# Patient Record
Sex: Female | Born: 1935 | Race: White | Hispanic: No | Marital: Married | Smoking: Never smoker
Health system: Southern US, Community
[De-identification: ages and names within clinical notes are randomized; demographics above are authoritative.]

## PROBLEM LIST (undated history)

## (undated) DIAGNOSIS — G4730 Sleep apnea, unspecified: Secondary | ICD-10-CM

## (undated) DIAGNOSIS — I208 Other forms of angina pectoris: Secondary | ICD-10-CM

## (undated) DIAGNOSIS — Z8711 Personal history of peptic ulcer disease: Secondary | ICD-10-CM

## (undated) DIAGNOSIS — I639 Cerebral infarction, unspecified: Secondary | ICD-10-CM

## (undated) DIAGNOSIS — Z9981 Dependence on supplemental oxygen: Secondary | ICD-10-CM

## (undated) DIAGNOSIS — N200 Calculus of kidney: Secondary | ICD-10-CM

## (undated) DIAGNOSIS — J45909 Unspecified asthma, uncomplicated: Secondary | ICD-10-CM

## (undated) DIAGNOSIS — I2089 Other forms of angina pectoris: Secondary | ICD-10-CM

## (undated) DIAGNOSIS — M1990 Unspecified osteoarthritis, unspecified site: Secondary | ICD-10-CM

## (undated) DIAGNOSIS — C9110 Chronic lymphocytic leukemia of B-cell type not having achieved remission: Secondary | ICD-10-CM

## (undated) DIAGNOSIS — I10 Essential (primary) hypertension: Secondary | ICD-10-CM

## (undated) DIAGNOSIS — Z8719 Personal history of other diseases of the digestive system: Secondary | ICD-10-CM

## (undated) DIAGNOSIS — E785 Hyperlipidemia, unspecified: Secondary | ICD-10-CM

## (undated) DIAGNOSIS — G459 Transient cerebral ischemic attack, unspecified: Secondary | ICD-10-CM

## (undated) HISTORY — DX: Unspecified asthma, uncomplicated: J45.909

## (undated) HISTORY — PX: LAPAROSCOPIC CHOLECYSTECTOMY: SUR755

## (undated) HISTORY — DX: Hyperlipidemia, unspecified: E78.5

## (undated) HISTORY — DX: Other forms of angina pectoris: I20.89

## (undated) HISTORY — PX: CARDIAC CATHETERIZATION: SHX172

## (undated) HISTORY — DX: Essential (primary) hypertension: I10

## (undated) HISTORY — DX: Cerebral infarction, unspecified: I63.9

## (undated) HISTORY — DX: Sleep apnea, unspecified: G47.30

## (undated) HISTORY — DX: Other forms of angina pectoris: I20.8

---

## 2006-04-08 ENCOUNTER — Encounter (INDEPENDENT_AMBULATORY_CARE_PROVIDER_SITE_OTHER): Payer: Self-pay | Admitting: Oncology

## 2006-04-08 ENCOUNTER — Other Ambulatory Visit: Admission: RE | Admit: 2006-04-08 | Discharge: 2006-04-08 | Payer: Self-pay | Admitting: Oncology

## 2006-04-11 ENCOUNTER — Ambulatory Visit: Payer: Self-pay | Admitting: Oncology

## 2006-09-30 ENCOUNTER — Ambulatory Visit: Payer: Self-pay | Admitting: Oncology

## 2013-01-12 ENCOUNTER — Encounter: Payer: Self-pay | Admitting: Pulmonary Disease

## 2013-01-12 ENCOUNTER — Ambulatory Visit (INDEPENDENT_AMBULATORY_CARE_PROVIDER_SITE_OTHER): Payer: Medicare Other | Admitting: Pulmonary Disease

## 2013-01-12 VITALS — BP 132/80 | HR 84 | Temp 98.2°F | Ht 61.0 in | Wt 191.0 lb

## 2013-01-12 DIAGNOSIS — R0609 Other forms of dyspnea: Secondary | ICD-10-CM

## 2013-01-12 DIAGNOSIS — R9389 Abnormal findings on diagnostic imaging of other specified body structures: Secondary | ICD-10-CM

## 2013-01-12 DIAGNOSIS — R0600 Dyspnea, unspecified: Secondary | ICD-10-CM | POA: Insufficient documentation

## 2013-01-12 DIAGNOSIS — J9610 Chronic respiratory failure, unspecified whether with hypoxia or hypercapnia: Secondary | ICD-10-CM

## 2013-01-12 DIAGNOSIS — R918 Other nonspecific abnormal finding of lung field: Secondary | ICD-10-CM | POA: Insufficient documentation

## 2013-01-12 NOTE — Assessment & Plan Note (Signed)
I have explained to the patient and her family member these areas usually are representative of inflammation, infection, and possibly malignancy.  I would find it very unlikely these are related to cancer, but she does need to get her health maintenance up-to-date.  More than likely these are inflammatory, and would favor a followup CT in 3-4 months after her initial CT.

## 2013-01-12 NOTE — Assessment & Plan Note (Signed)
The patient gives a history for progressive dyspnea on exertion and now at rest, along with oxygen-dependent respiratory failure.  There is nothing to suggest this is related to asthma, and her chest is totally clear today except for crackles at both bases.  She had no thromboembolic disease on a recent CT chest, but this does not exclude the possibility of chronic thromboembolic disease.  She does have a history of significant hypertension requiring multiple medications for control, and her recent chest x-ray did show cardiomegaly with prominent vascular markings.  I am very suspicious that she may have significant diastolic heart failure, especially with crackles on exam in both bases today.  At this point, I would like to schedule for pulmonary function studies, as well as an echocardiogram to look at her heart and also the pulmonary artery pressures.  She is to continue on her current medications at this time.

## 2013-01-12 NOTE — Progress Notes (Signed)
  Subjective:    Patient ID: Ann Beasley, female    DOB: [birth year], 79 y.o.   MRN: [id number]  HPI The patient is a 79 year old female who I've been asked to see for worsening dyspnea and exertion and hypoxemia.  The patient has never smoked, but does have a history of significant long-standing hypertension.  She is having breathing issues with activities for at least a year, and this has been very progressive and debilitating over the last 5 months.  This has gotten to the point that she can become short of breath even at rest if she does not wear oxygen.  She denies significant cough or mucus production, and has not had significant upper extremity edema.  She does have a history of asthma that was diagnosed in her 40s, and currently is on Advair as well as prednisone.  She went to the [facility] emergency room in July for shortness of breath, and had a CT of her chest that did not show a pulmonary embolus or other acute process, but did show pleural based mass like opacities in the right upper and left lower lobes.  Her chest x-ray during this visit was suggestive of mild interstitial edema.  She has no history to suggest a pulmonary infection at the time of her CT chest.  The patient did have a stress test greater than one year ago that was unremarkable, but has not had a recent echocardiogram.  She denies any history of pulmonary embolus or DVT.  She does have a history of sleep apnea for which she is on CPAP but is noncompliant.  She tells me that her health maintenance is not up-to-date, and is overdue for a mammogram.   Review of Systems  Constitutional: Negative for fever and unexpected weight change.  HENT: Positive for rhinorrhea ( with O2). Negative for ear pain, nosebleeds, congestion, sore throat, sneezing, trouble swallowing, dental problem, postnasal drip and sinus pressure.   Eyes: Negative for redness and itching.  Respiratory: Positive for cough and shortness of breath. Negative for  chest tightness and wheezing.   Cardiovascular: Negative for palpitations and leg swelling.  Gastrointestinal: Negative for nausea and vomiting.  Genitourinary: Negative for dysuria.  Musculoskeletal: Negative for joint swelling.  Skin: Negative for rash.  Neurological: Negative for headaches.  Hematological: Does not bruise/bleed easily.  Psychiatric/Behavioral: Negative for dysphoric mood. The patient is not nervous/anxious.        Objective:   Physical Exam Constitutional:  Overweight female, no acute distress  HENT:  Nares patent without discharge  Oropharynx without exudate, palate and uvula are normal  Eyes:  Perrla, eomi, no scleral icterus  Neck:  No JVD, no TMG  Cardiovascular:  Normal rate, regular rhythm, no rubs or gallops.  2/6 sem        Intact distal pulses  Pulmonary :  Normal breath sounds, no stridor or respiratory distress   No rhonchi, or wheezing, significant crackles in both bases.   Abdominal:  Soft, nondistended, bowel sounds present.  No tenderness noted.   Musculoskeletal:  mild lower extremity edema noted.  Lymph Nodes:  No cervical lymphadenopathy noted  Skin:  No cyanosis noted  Neurologic:  Alert, appropriate, moves all 4 extremities without obvious deficit.         Assessment & Plan:

## 2013-01-12 NOTE — Patient Instructions (Addendum)
Will schedule for breathing tests and a sound wave test of your heart. Stay on oxygen 24hrs a day. No change in current medications.

## 2013-01-16 ENCOUNTER — Telehealth: Payer: Self-pay | Admitting: Pulmonary Disease

## 2013-01-16 NOTE — Telephone Encounter (Signed)
I spoke with Marcelino Duster. [facility] has no openings that day. He did have appt 02/13/13 at 10 AM. She just scheduled this appt. Nothing further was needed

## 2013-01-19 ENCOUNTER — Other Ambulatory Visit (HOSPITAL_COMMUNITY): Payer: Medicare Other

## 2013-02-07 ENCOUNTER — Ambulatory Visit (INDEPENDENT_AMBULATORY_CARE_PROVIDER_SITE_OTHER): Payer: Medicare Other | Admitting: Pulmonary Disease

## 2013-02-07 DIAGNOSIS — R0989 Other specified symptoms and signs involving the circulatory and respiratory systems: Secondary | ICD-10-CM

## 2013-02-07 DIAGNOSIS — J9610 Chronic respiratory failure, unspecified whether with hypoxia or hypercapnia: Secondary | ICD-10-CM

## 2013-02-07 DIAGNOSIS — R0600 Dyspnea, unspecified: Secondary | ICD-10-CM

## 2013-02-07 DIAGNOSIS — R0609 Other forms of dyspnea: Secondary | ICD-10-CM

## 2013-02-07 LAB — PULMONARY FUNCTION TEST

## 2013-02-07 NOTE — Progress Notes (Signed)
PFT done today. 

## 2013-02-12 ENCOUNTER — Telehealth: Payer: Self-pay | Admitting: Pulmonary Disease

## 2013-02-12 NOTE — Telephone Encounter (Signed)
LMOM x 1 

## 2013-02-12 NOTE — Telephone Encounter (Signed)
Please let pt know there are some abnormalities on her breathing studies, but trying to determine if they are being caused by a lung or heart issue.  Will arrange a followup visit once her echo is done.

## 2013-02-13 ENCOUNTER — Ambulatory Visit (INDEPENDENT_AMBULATORY_CARE_PROVIDER_SITE_OTHER): Payer: Medicare Other | Admitting: Pulmonary Disease

## 2013-02-13 ENCOUNTER — Encounter: Payer: Self-pay | Admitting: Pulmonary Disease

## 2013-02-13 VITALS — BP 102/62 | HR 83 | Temp 98.2°F

## 2013-02-13 DIAGNOSIS — R0609 Other forms of dyspnea: Secondary | ICD-10-CM

## 2013-02-13 DIAGNOSIS — J9610 Chronic respiratory failure, unspecified whether with hypoxia or hypercapnia: Secondary | ICD-10-CM

## 2013-02-13 DIAGNOSIS — R0600 Dyspnea, unspecified: Secondary | ICD-10-CM

## 2013-02-13 DIAGNOSIS — R9389 Abnormal findings on diagnostic imaging of other specified body structures: Secondary | ICD-10-CM

## 2013-02-13 NOTE — Telephone Encounter (Signed)
Patient seen in office today.  Aware of results of both PFT and ECHO. Nothing further needed.

## 2013-02-13 NOTE — Assessment & Plan Note (Signed)
The patient continues to have significant dyspnea, part of which is secondary to her debility and significant weakness and obesity.  However, this does not completely explain her hypoxemia.  I am not able to identify a unifying primary pulmonary issue to explain her current situation, although this may simply be a combination of many different factors.  Her echocardiogram had a poor acoustic window, and therefore was not completely helpful.  I am wondering how much of this is related to diastolic heart failure, and would also like to exclude the possibility of pulmonary hypertension.  I think she needs to have a right heart catheterization to evaluate her pulmonary pressures, as well as her left atrial pressure.  I also think she needs to have a sat run to rule out possible shunting.  The patient and her daughter are agreeable with this approach.  I will refer to cardiology to arrange.

## 2013-02-13 NOTE — Patient Instructions (Addendum)
Continue working on weight loss. Will refer you to cardiology for evaluation, and consideration of a right heart catheterization.   Will arrange followup with me after this visit.

## 2013-02-13 NOTE — Progress Notes (Signed)
  Subjective:    Patient ID: Ann Beasley, female    DOB: 11-Jun-1931, 79 y.o.   MRN: [id number]  HPI The patient comes in today for followup of her recent PFTs and echocardiogram, done as part of a workup for dyspnea and hypoxemia.  Her echocardiogram was a difficult study due to poor acoustic window, but showed a normal ejection fraction with LVH and diastolic dysfunction.  The left atrium was normal in size, as was the right ventricle.  Pulmonary artery pressures were not estimated, but were felt to be normal.  Pulmonary function studies showed no significant airflow obstruction, but she did have mild air trapping.  Her total lung capacity was mildly reduced, and her diffusion capacity was severely reduced.  She had had a followup CT chest for her abnormal pleural-based densities, and this was done September 2 at [facility].  Overall, this showed no evidence of pulmonary embolus, and the subpleural opacities that were pleural-based had improved significantly.  There was no evidence for underlying interstitial disease.   Review of Systems  Constitutional: Negative for fever and unexpected weight change.  HENT: Negative for ear pain, nosebleeds, congestion, sore throat, rhinorrhea, sneezing, trouble swallowing, dental problem, postnasal drip and sinus pressure.   Eyes: Negative for redness and itching.  Respiratory: Negative for cough, chest tightness, shortness of breath and wheezing.   Cardiovascular: Negative for palpitations and leg swelling.  Gastrointestinal: Negative for nausea and vomiting.  Genitourinary: Negative for dysuria.  Musculoskeletal: Negative for joint swelling.  Skin: Negative for rash.  Neurological: Negative for headaches.  Hematological: Does not bruise/bleed easily.  Psychiatric/Behavioral: Negative for dysphoric mood. The patient is not nervous/anxious.        Objective:   Physical Exam Obese female in nad Nose without purulence or discharge Neck  without LN or TMG Chest with mild basilar crackles, no wheezing Cor with rrr LE with mild edema, no cyanosis Alert and oriented, moves all 4.        Assessment & Plan:

## 2013-02-13 NOTE — Assessment & Plan Note (Signed)
The patient's subpleural opacities are significantly improved on her followup CT scan.  These are nonspecific, and may represent rounded atelectasis, or possibly an inflammatory focus such as BOOP.  This does not seem to be a diffuse parenchymal process, and therefore it is unclear how much this is contributing to her dyspnea and hypoxemia.  Since she has had significant improvement, I would continue to follow this process.  There is nothing to suggest this is infectious in origin.

## 2013-02-22 ENCOUNTER — Encounter: Payer: Self-pay | Admitting: Pulmonary Disease

## 2013-02-28 ENCOUNTER — Encounter (HOSPITAL_COMMUNITY): Payer: Self-pay | Admitting: *Deleted

## 2013-02-28 ENCOUNTER — Encounter (HOSPITAL_COMMUNITY): Payer: Self-pay

## 2013-02-28 ENCOUNTER — Ambulatory Visit (HOSPITAL_COMMUNITY)
Admission: RE | Admit: 2013-02-28 | Discharge: 2013-02-28 | Disposition: A | Discharge status: Home / Self Care | Payer: Medicare Other | Source: Ambulatory Visit | Attending: Internal Medicine | Admitting: Internal Medicine

## 2013-02-28 VITALS — BP 138/76 | HR 70 | Ht 61.0 in | Wt 184.0 lb

## 2013-02-28 DIAGNOSIS — R0609 Other forms of dyspnea: Secondary | ICD-10-CM

## 2013-02-28 DIAGNOSIS — R0600 Dyspnea, unspecified: Secondary | ICD-10-CM

## 2013-02-28 DIAGNOSIS — R0902 Hypoxemia: Secondary | ICD-10-CM | POA: Insufficient documentation

## 2013-02-28 NOTE — Patient Instructions (Addendum)
You are scheduled for heart catheterization on Fri 10/17 see instruction sheet  We will contact you in 3 months to schedule your next appointment.

## 2013-02-28 NOTE — Addendum Note (Signed)
Encounter addended by: Noralee Space, RN on: 02/28/2013  2:28 PM<BR>     Documentation filed: Patient Instructions Section

## 2013-02-28 NOTE — Progress Notes (Signed)
Patient ID: ATLEY SCARBORO, female   DOB: 04-17-1932, 79 y.o.   MRN: [id number] Referring MD: Dr Shelle Iron Pulmonolgist: Dr Shelle Iron PCP: Sudie Bailey  HPI: Mrs Lesmeister is referred to the HF clinic by Dr Shelle Iron for increase dyspnea.    Mrs Thursby is a 79 year old with a history of HTN, asthma, OSA- noncompliant with CPAP, and CVA. Denies known h/o CAD. Has never had a cath has had negative stress tests in past.  Non-smoker. No recent second-hand smoke exposure. 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  Over past year has developed marked dyspnea with any exertion and even at rest. Also noted her saturations began to drop into 70s.   Evaluated at [facility] in July. CT  chest negative for pullmonary embolus or other acute process, but did show pleural based mass like opacities in the right upper and left lower lobes. No evidence of ILD. PCP started some prednisone which helped a bit.   ECHO LVEF 67% mild LVH grade 1 diastolic dysfunction. RV normal no TR  PFTs  FEV1 1.55 (69%) FVC 1.18   (71%) Ratio 102% FEF 25-75% 0.9 (72%) DLCO 46%  Saw Dr. Shelle Iron who did not think she had any significant underlying parenchymal lung disease. Referred here for RHC and shunt run.   Denies any recent CP or pressure. Walks with cane. Uses oxygen 24-7. Previously had coughing spells but now resolved. Uses nebulizer for wheezing PRN. No edema, orthopnea or PND. Struggles with her ADLs. No rash arthropathies.  Review of Systems:     Cardiac Review of Systems: {Y] = yes [ ]  = no  Chest Pain [    ]  Resting SOB [ y  ] Exertional SOB  [  y]  Orthopnea [  ]   Pedal Edema [   ]    Palpitations [  ] Syncope  [  ]   Presyncope [   ]  General Review of Systems: [Y] = yes [  ]=no Constitional: recent weight change [  ]; anorexia [  ]; fatigue [  ]; nausea [  ]; night sweats [  ]; fever [  ]; or chills [  ];                                                                                                                      Dental: poor dentition[  ];   Eye : blurred vision [  ]; diplopia [   ]; vision changes [  ];  Amaurosis fugax[  ]; Resp: cough [  ];  wheezing[  ];  hemoptysis[  ]; shortness of breath[ y ]; paroxysmal nocturnal dyspnea[  ]; dyspnea on exertion[ y ]; or orthopnea[  ];  GI:  gallstones[  ], vomiting[  ];  dysphagia[  ]; melena[  ];  hematochezia [  ]; heartburn[  ];   Hx of  Colonoscopy[  ]; GU: kidney stones [  ]; hematuria[  ];   dysuria [  ];  nocturia[  ];  history of     obstruction [  ];                 Skin: rash, swelling[ y ];, hair loss[  ];  peripheral edema[  ];  or itching[  ]; Musculosketetal: myalgias[  ];  joint swelling[  ];  joint erythema[  ];  joint pain[ y ];  back pain[  ];  Heme/Lymph: bruising[  ];  bleeding[  ];  anemia[  ];  Neuro: TIA[  ];  headaches[  ];  stroke[  ];  vertigo[  ];  seizures[  ];   paresthesias[ y ];  difficulty walking[  ];  Psych:depression[  ]; anxiety[  ];  Endocrine: diabetes[  ];  thyroid dysfunction[  ];  Other:    Past Medical History  Diagnosis Date  . HTN (hypertension)   . Angina at rest     history of  . Asthma   . Hyperlipidemia   . Stroke   . Sleep apnea     Current Outpatient Prescriptions  Medication Sig Dispense Refill  . albuterol (PROVENTIL) (2.5 MG/3ML) 0.083% nebulizer solution Take 2.5 mg by nebulization every 6 (six) hours as needed for wheezing or shortness of breath.      Marland Kitchen amLODipine (NORVASC) 2.5 MG tablet Take 2.5 mg by mouth daily.      Marland Kitchen aspirin 81 MG tablet Take 81 mg by mouth daily.      . carvedilol (COREG) 6.25 MG tablet Take 6.25 mg by mouth 2 (two) times daily with a meal.      . donepezil (ARICEPT) 5 MG tablet Take 5 mg by mouth at bedtime.      . ergocalciferol (VITAMIN D2) 50000 UNITS capsule Take 50,000 Units by mouth once a week.      . Fluticasone-Salmeterol (ADVAIR) 250-50 MCG/DOSE AEPB Inhale 1 puff into the lungs every 12 (twelve) hours.      . furosemide (LASIX) 20 MG tablet Take 20 mg by  mouth every Monday, Wednesday, and Friday.      . gabapentin (NEURONTIN) 100 MG capsule Take 100 mg by mouth 3 (three) times daily.      . hydrALAZINE (APRESOLINE) 50 MG tablet Take 50 mg by mouth 2 (two) times daily.      Marland Kitchen losartan (COZAAR) 100 MG tablet Take 100 mg by mouth daily.      . meclizine (ANTIVERT) 25 MG tablet Take 25 mg by mouth 3 (three) times daily as needed.      . montelukast (SINGULAIR) 10 MG tablet Take 10 mg by mouth at bedtime.      . pravastatin (PRAVACHOL) 20 MG tablet Take 20 mg by mouth daily.      . predniSONE (DELTASONE) 2.5 MG tablet Take 2.5 mg by mouth daily.       No current facility-administered medications for this encounter.     Not on File  History   Social History  . Marital Status: Married    Spouse Name: N/A    Number of Children: N/A  . Years of Education: N/A   Occupational History  . retired    Social History Main Topics  . Smoking status: Never Smoker   . Smokeless tobacco: Not on file  . Alcohol Use: No  . Drug Use: No  . Sexual Activity: Not on file   Other Topics Concern  . Not on file   Social History Narrative  . No narrative on file  Family History  Problem Relation Age of Onset  . Hypertension Father   . Bone cancer Father     multiple type of cancers  . Heart disease Mother     PHYSICAL EXAM: Filed Vitals:   02/28/13 1355  BP: 138/76  Pulse: 70   General:  Elderly sitting in WC. On O2 no acute distress HEENT: normal Neck: supple. no JVD. Carotids 2+ bilat; no bruits. No lymphadenopathy or thryomegaly appreciated. Cor: PMI nondisplaced. Regular rate & rhythm. No rubs, gallops or murmurs. Lungs: clear. Mild crackles in R base Abdomen: obese soft, nontender, nondistended. No hepatosplenomegaly. No bruits or masses. Good bowel sounds. Extremities: no cyanosis, clubbing, rash, edema Neuro: alert & oriented x 3, cranial nerves grossly intact. moves all 4 extremities w/o difficulty. Affect pleasant.   No  results found for this or any previous visit (from the past 24 hour(s)). No results found.   ASSESSMENT & PLAN:  1. Dyspnea, severe and hypoxemia      -- Her constellation of findings - hypoxemia, severe dyspnea, relatively normal spirometry and abnormal DLCO is certainly suggestive of PAH however her echo does not support this. I agree with Dr. Shelle Iron that she need RHC to clearly assess her pulmonary pressures as well as look for a shunt. Although if she had a significant intracardiac shunt I would not expect her hypoxemia to correct with supplemental O2. If RHC negative would strongly consider further work-up for pulmonary issues as well as refer to pulmonary rehab. We discussed the putative benefits of exercise and weight loss.   2. Morbidity obesity - as above  Truman Hayward 2:14 PM

## 2013-03-02 ENCOUNTER — Other Ambulatory Visit (HOSPITAL_COMMUNITY): Payer: Self-pay | Admitting: Adult Health

## 2013-03-02 DIAGNOSIS — I272 Pulmonary hypertension, unspecified: Secondary | ICD-10-CM

## 2013-03-06 ENCOUNTER — Ambulatory Visit: Payer: Medicare Other | Admitting: Pulmonary Disease

## 2013-03-16 ENCOUNTER — Ambulatory Visit (HOSPITAL_COMMUNITY)
Admission: RE | Admit: 2013-03-16 | Discharge: 2013-03-16 | Disposition: A | Discharge status: Home / Self Care | Payer: Medicare Other | Source: Ambulatory Visit | Attending: Internal Medicine | Admitting: Internal Medicine

## 2013-03-16 ENCOUNTER — Encounter (HOSPITAL_COMMUNITY)
Admission: RE | Disposition: A | Discharge status: Home / Self Care | Payer: Self-pay | Source: Ambulatory Visit | Attending: Internal Medicine

## 2013-03-16 DIAGNOSIS — R799 Abnormal finding of blood chemistry, unspecified: Secondary | ICD-10-CM | POA: Insufficient documentation

## 2013-03-16 DIAGNOSIS — R0609 Other forms of dyspnea: Secondary | ICD-10-CM | POA: Insufficient documentation

## 2013-03-16 DIAGNOSIS — I2789 Other specified pulmonary heart diseases: Secondary | ICD-10-CM | POA: Insufficient documentation

## 2013-03-16 DIAGNOSIS — I279 Pulmonary heart disease, unspecified: Secondary | ICD-10-CM

## 2013-03-16 DIAGNOSIS — J45909 Unspecified asthma, uncomplicated: Secondary | ICD-10-CM | POA: Insufficient documentation

## 2013-03-16 DIAGNOSIS — I272 Pulmonary hypertension, unspecified: Secondary | ICD-10-CM

## 2013-03-16 DIAGNOSIS — Z79899 Other long term (current) drug therapy: Secondary | ICD-10-CM | POA: Insufficient documentation

## 2013-03-16 DIAGNOSIS — G4733 Obstructive sleep apnea (adult) (pediatric): Secondary | ICD-10-CM | POA: Insufficient documentation

## 2013-03-16 DIAGNOSIS — I10 Essential (primary) hypertension: Secondary | ICD-10-CM | POA: Insufficient documentation

## 2013-03-16 DIAGNOSIS — Z8673 Personal history of transient ischemic attack (TIA), and cerebral infarction without residual deficits: Secondary | ICD-10-CM | POA: Insufficient documentation

## 2013-03-16 DIAGNOSIS — R0989 Other specified symptoms and signs involving the circulatory and respiratory systems: Secondary | ICD-10-CM | POA: Insufficient documentation

## 2013-03-16 HISTORY — PX: RIGHT HEART CATHETERIZATION: SHX5447

## 2013-03-16 LAB — BASIC METABOLIC PANEL
BUN: 19 mg/dL (ref 6–23)
CO2: 31 mEq/L (ref 19–32)
Calcium: 9.3 mg/dL (ref 8.4–10.5)
Creatinine, Ser: 0.75 mg/dL (ref 0.50–1.10)
GFR calc non Af Amer: 78 mL/min — ABNORMAL LOW (ref >90)
Glucose, Bld: 103 mg/dL — ABNORMAL HIGH (ref 70–99)
Sodium: 141 mEq/L (ref 135–145)

## 2013-03-16 LAB — PROTIME-INR: Prothrombin Time: 15 seconds (ref 11.6–15.2)

## 2013-03-16 LAB — POCT I-STAT 3, VENOUS BLOOD GAS (G3P V)
Acid-Base Excess: 2 mmol/L (ref 0.0–2.0)
Acid-Base Excess: 4 mmol/L — ABNORMAL HIGH (ref 0.0–2.0)
Acid-Base Excess: 1 mmol/L (ref 0.0–2.0)
Bicarbonate: 28.6 mEq/L — ABNORMAL HIGH (ref 20.0–24.0)
O2 Saturation: 61 %
O2 Saturation: 63 %
TCO2: 30 mmol/L (ref 0–100)
TCO2: 32 mmol/L (ref 0–100)
TCO2: 29 mmol/L (ref 0–100)
pCO2, Ven: 56.9 mmHg — ABNORMAL HIGH (ref 45.0–50.0)
pCO2, Ven: 53.9 mmHg — ABNORMAL HIGH (ref 45.0–50.0)
pH, Ven: 7.32 — ABNORMAL HIGH (ref 7.250–7.300)
pO2, Ven: 35 mmHg (ref 30.0–45.0)
pO2, Ven: 36 mmHg (ref 30.0–45.0)

## 2013-03-16 LAB — CBC
MCH: 31.8 pg (ref 26.0–34.0)
Platelets: 158 10*3/uL (ref 150–400)
RBC: 3.11 MIL/uL — ABNORMAL LOW (ref 3.87–5.11)
WBC: 2.7 10*3/uL — ABNORMAL LOW (ref 4.0–10.5)

## 2013-03-16 LAB — POCT I-STAT 3, ART BLOOD GAS (G3+)
Acid-base deficit: 6 mmol/L — ABNORMAL HIGH (ref 0.0–2.0)
Bicarbonate: 21.6 mEq/L (ref 20.0–24.0)

## 2013-03-16 SURGERY — RIGHT HEART CATH
Anesthesia: LOCAL

## 2013-03-16 MED ORDER — HEPARIN (PORCINE) IN NACL 2-0.9 UNIT/ML-% IJ SOLN
INTRAMUSCULAR | Status: AC
  Filled 2013-03-16: qty 1000

## 2013-03-16 MED ORDER — MIDAZOLAM HCL 2 MG/2ML IJ SOLN
INTRAMUSCULAR | Status: AC
  Filled 2013-03-16: qty 2

## 2013-03-16 MED ORDER — ACETAMINOPHEN 325 MG PO TABS: 650.0000 mg | ORAL_TABLET | ORAL | Status: DC | PRN

## 2013-03-16 MED ORDER — SODIUM CHLORIDE 0.9 % IV SOLN: INTRAVENOUS | Status: DC

## 2013-03-16 MED ORDER — SODIUM CHLORIDE 0.9 % IJ SOLN: 3.0000 mL | Freq: Two times a day (BID) | INTRAMUSCULAR | Status: DC

## 2013-03-16 MED ORDER — SODIUM CHLORIDE 0.9 % IV SOLN: 250.0000 mL | INTRAVENOUS | Status: DC | PRN

## 2013-03-16 MED ORDER — ONDANSETRON HCL 4 MG/2ML IJ SOLN: 4.0000 mg | Freq: Four times a day (QID) | INTRAMUSCULAR | Status: DC | PRN

## 2013-03-16 MED ORDER — FENTANYL CITRATE 0.05 MG/ML IJ SOLN
INTRAMUSCULAR | Status: AC
  Filled 2013-03-16: qty 2

## 2013-03-16 MED ORDER — LIDOCAINE HCL (PF) 1 % IJ SOLN
INTRAMUSCULAR | Status: AC
  Filled 2013-03-16: qty 30

## 2013-03-16 MED ORDER — POTASSIUM CHLORIDE CRYS ER 20 MEQ PO TBCR
40.0000 meq | EXTENDED_RELEASE_TABLET | Freq: Once | ORAL | Status: AC
  Administered 2013-03-16: 40 meq via ORAL
  Filled 2013-03-16: qty 2

## 2013-03-16 MED ORDER — NITROGLYCERIN 0.2 MG/ML ON CALL CATH LAB
INTRAVENOUS | Status: AC
  Filled 2013-03-16: qty 1

## 2013-03-16 MED ORDER — SODIUM CHLORIDE 0.9 % IJ SOLN: 3.0000 mL | INTRAMUSCULAR | Status: DC | PRN

## 2013-03-16 NOTE — Progress Notes (Signed)
UP AND WALKED AND TOL WELL AND RIGHT GROIN STABLE; NO BLEEDING OR HEMATOMA 

## 2013-03-16 NOTE — Interval H&P Note (Signed)
History and Physical Interval Note:  03/16/2013 1:39 PM  Ann Beasley  has presented today for surgery, with the diagnosis of pulmonary HTN  The various methods of treatment have been discussed with the patient and family. After consideration of risks, benefits and other options for treatment, the patient has consented to  Procedure(s): RIGHT HEART CATH (N/A) as a surgical intervention .  The patient's history has been reviewed, patient examined, no change in status, stable for surgery.  I have reviewed the patient's chart and labs.  Questions were answered to the patient's satisfaction.     Daniel Bensimhon

## 2013-03-16 NOTE — CV Procedure (Signed)
Cardiac Cath Procedure Note:  Indication:  Dyspnea  Procedures performed:  1) Right heart catheterization  Description of procedure:   The risks and indication of the procedure were explained. Consent was signed and placed on the chart. An appropriate timeout was taken prior to the procedure. The right groin was prepped and draped in the routine sterile fashion and anesthetized with 1% local lidocaine.   A 7 FR venous sheath was placed in the right femoral vein using a modified Seldinger technique. A standard Swan-Ganz catheter was used for the procedure.   Complications: None apparent.  Findings:  RA = 6 RV = 46/1/6 PA = 46/15 (28) PCW = 13 Fick cardiac output/index = 6.7/3.7 PVR = 2.3 Woods FA sat = 90% PA sat = 62%, 63% SVC sat = 63%  Assessment: 1. Minimal pulmonary HTN with normal PVR and cardiac output  Plan/Discussion:  Pulmonary pressures just slightly elevated and doubt they are contributing significantly to her symptoms. Pre cath labs notable for WBC 2.7, Hgb 9.9, PLT 158K and K 3.1. Potassium supplemented. Will need f/u on her lab abnormalities with PCP and possibly Hematology.  Daniel Bensimhon 2:05 PM

## 2013-03-16 NOTE — Progress Notes (Signed)
Pt voided 400cc of yellow urine

## 2013-03-16 NOTE — Progress Notes (Signed)
PER DR BENSIMHON ONLY ONE IV NEEDED AND NO ASPIRIN NEEDED

## 2013-03-16 NOTE — Progress Notes (Signed)
DR BENSIMHON NOTIFIED OF CBC RESULTS

## 2013-03-16 NOTE — H&P (View-Only) (Signed)
Patient ID: Amberleigh G Purnell, female   DOB: 03/27/1932, 79 y.o.   MRN: [id number] Referring MD: Dr Clance Pulmonolgist: Dr Clance PCP: [place]au  HPI: Mrs Richeson is referred to the HF clinic by Dr Clance for increase dyspnea.    Mrs Dennin is a 79 year old with a history of HTN, asthma, OSA- noncompliant with CPAP, and CVA. Denies known h/o CAD. Has never had a cath has had negative stress tests in past.  Non-smoker. No recent second-hand smoke exposure. 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  Over past year has developed marked dyspnea with any exertion and even at rest. Also noted her saturations began to drop into 70s.   Evaluated at [facility] in July. CT  chest negative for pullmonary embolus or other acute process, but did show pleural based mass like opacities in the right upper and left lower lobes. No evidence of ILD. PCP started some prednisone which helped a bit.   ECHO LVEF 67% mild LVH grade 1 diastolic dysfunction. RV normal no TR  PFTs  FEV1 1.55 (69%) FVC 1.18   (71%) Ratio 102% FEF 25-75% 0.9 (72%) DLCO 46%  Saw Dr. Clance who did not think she had any significant underlying parenchymal lung disease. Referred here for RHC and shunt run.   Denies any recent CP or pressure. Walks with cane. Uses oxygen 24-7. Previously had coughing spells but now resolved. Uses nebulizer for wheezing PRN. No edema, orthopnea or PND. Struggles with her ADLs. No rash arthropathies.  Review of Systems:     Cardiac Review of Systems: {Y] = yes [ ] = no  Chest Pain [    ]  Resting SOB [ y  ] Exertional SOB  [  y]  Orthopnea [  ]   Pedal Edema [   ]    Palpitations [  ] Syncope  [  ]   Presyncope [   ]  General Review of Systems: [Y] = yes [  ]=no Constitional: recent weight change [  ]; anorexia [  ]; fatigue [  ]; nausea [  ]; night sweats [  ]; fever [  ]; or chills [  ];                                                                                                                      Dental: poor dentition[  ];   Eye : blurred vision [  ]; diplopia [   ]; vision changes [  ];  Amaurosis fugax[  ]; Resp: cough [  ];  wheezing[  ];  hemoptysis[  ]; shortness of breath[ y ]; paroxysmal nocturnal dyspnea[  ]; dyspnea on exertion[ y ]; or orthopnea[  ];  GI:  gallstones[  ], vomiting[  ];  dysphagia[  ]; melena[  ];  hematochezia [  ]; heartburn[  ];   Hx of  Colonoscopy[  ]; GU: kidney stones [  ]; hematuria[  ];   dysuria [  ];    nocturia[  ];  history of     obstruction [  ];                 Skin: rash, swelling[ y ];, hair loss[  ];  peripheral edema[  ];  or itching[  ]; Musculosketetal: myalgias[  ];  joint swelling[  ];  joint erythema[  ];  joint pain[ y ];  back pain[  ];  Heme/Lymph: bruising[  ];  bleeding[  ];  anemia[  ];  Neuro: TIA[  ];  headaches[  ];  stroke[  ];  vertigo[  ];  seizures[  ];   paresthesias[ y ];  difficulty walking[  ];  Psych:depression[  ]; anxiety[  ];  Endocrine: diabetes[  ];  thyroid dysfunction[  ];  Other:    Past Medical History  Diagnosis Date  . HTN (hypertension)   . Angina at rest     history of  . Asthma   . Hyperlipidemia   . Stroke   . Sleep apnea     Current Outpatient Prescriptions  Medication Sig Dispense Refill  . albuterol (PROVENTIL) (2.5 MG/3ML) 0.083% nebulizer solution Take 2.5 mg by nebulization every 6 (six) hours as needed for wheezing or shortness of breath.      . amLODipine (NORVASC) 2.5 MG tablet Take 2.5 mg by mouth daily.      . aspirin 81 MG tablet Take 81 mg by mouth daily.      . carvedilol (COREG) 6.25 MG tablet Take 6.25 mg by mouth 2 (two) times daily with a meal.      . donepezil (ARICEPT) 5 MG tablet Take 5 mg by mouth at bedtime.      . ergocalciferol (VITAMIN D2) 50000 UNITS capsule Take 50,000 Units by mouth once a week.      . Fluticasone-Salmeterol (ADVAIR) 250-50 MCG/DOSE AEPB Inhale 1 puff into the lungs every 12 (twelve) hours.      . furosemide (LASIX) 20 MG tablet Take 20 mg by  mouth every Monday, Wednesday, and Friday.      . gabapentin (NEURONTIN) 100 MG capsule Take 100 mg by mouth 3 (three) times daily.      . hydrALAZINE (APRESOLINE) 50 MG tablet Take 50 mg by mouth 2 (two) times daily.      . losartan (COZAAR) 100 MG tablet Take 100 mg by mouth daily.      . meclizine (ANTIVERT) 25 MG tablet Take 25 mg by mouth 3 (three) times daily as needed.      . montelukast (SINGULAIR) 10 MG tablet Take 10 mg by mouth at bedtime.      . pravastatin (PRAVACHOL) 20 MG tablet Take 20 mg by mouth daily.      . predniSONE (DELTASONE) 2.5 MG tablet Take 2.5 mg by mouth daily.       No current facility-administered medications for this encounter.     Not on File  History   Social History  . Marital Status: Married    Spouse Name: N/A    Number of Children: N/A  . Years of Education: N/A   Occupational History  . retired    Social History Main Topics  . Smoking status: Never Smoker   . Smokeless tobacco: Not on file  . Alcohol Use: No  . Drug Use: No  . Sexual Activity: Not on file   Other Topics Concern  . Not on file   Social History Narrative  . No narrative on file      Family History  Problem Relation Age of Onset  . Hypertension Father   . Bone cancer Father     multiple type of cancers  . Heart disease Mother     PHYSICAL EXAM: Filed Vitals:   02/28/13 1355  BP: 138/76  Pulse: 70   General:  Elderly sitting in WC. On O2 no acute distress HEENT: normal Neck: supple. no JVD. Carotids 2+ bilat; no bruits. No lymphadenopathy or thryomegaly appreciated. Cor: PMI nondisplaced. Regular rate & rhythm. No rubs, gallops or murmurs. Lungs: clear. Mild crackles in R base Abdomen: obese soft, nontender, nondistended. No hepatosplenomegaly. No bruits or masses. Good bowel sounds. Extremities: no cyanosis, clubbing, rash, edema Neuro: alert & oriented x 3, cranial nerves grossly intact. moves all 4 extremities w/o difficulty. Affect pleasant.   No  results found for this or any previous visit (from the past 24 hour(s)). No results found.   ASSESSMENT & PLAN:  1. Dyspnea, severe and hypoxemia      -- Her constellation of findings - hypoxemia, severe dyspnea, relatively normal spirometry and abnormal DLCO is certainly suggestive of PAH however her echo does not support this. I agree with Dr. Clance that she need RHC to clearly assess her pulmonary pressures as well as look for a shunt. Although if she had a significant intracardiac shunt I would not expect her hypoxemia to correct with supplemental O2. If RHC negative would strongly consider further work-up for pulmonary issues as well as refer to pulmonary rehab. We discussed the putative benefits of exercise and weight loss.   2. Morbidity obesity - as above  Taim Wurm,MD 2:14 PM    

## 2013-04-05 ENCOUNTER — Other Ambulatory Visit: Payer: Self-pay

## 2013-04-06 ENCOUNTER — Ambulatory Visit (INDEPENDENT_AMBULATORY_CARE_PROVIDER_SITE_OTHER): Payer: Medicare Other | Admitting: Pulmonary Disease

## 2013-04-06 ENCOUNTER — Encounter: Payer: Self-pay | Admitting: Pulmonary Disease

## 2013-04-06 VITALS — BP 118/62 | HR 64 | Temp 98.0°F | Ht 61.0 in | Wt 182.0 lb

## 2013-04-06 DIAGNOSIS — R0989 Other specified symptoms and signs involving the circulatory and respiratory systems: Secondary | ICD-10-CM

## 2013-04-06 DIAGNOSIS — R0902 Hypoxemia: Secondary | ICD-10-CM

## 2013-04-06 DIAGNOSIS — R9389 Abnormal findings on diagnostic imaging of other specified body structures: Secondary | ICD-10-CM

## 2013-04-06 DIAGNOSIS — R0609 Other forms of dyspnea: Secondary | ICD-10-CM

## 2013-04-06 DIAGNOSIS — J9610 Chronic respiratory failure, unspecified whether with hypoxia or hypercapnia: Secondary | ICD-10-CM

## 2013-04-06 DIAGNOSIS — R0600 Dyspnea, unspecified: Secondary | ICD-10-CM

## 2013-04-06 NOTE — Assessment & Plan Note (Signed)
The patient has chronic dyspnea on exertion and hypoxemia of unknown origin.  There is no evidence for thromboembolic disease, and no pulmonary hypertension on recent right heart catheterization.  Her PFTs have shown mild restriction related to her morbid obesity, but no interstitial disease on her CT chest.  She has had a severe decreased DLCO.  We have not evaluated her for intrapulmonary or intracardiac shunting, and I think she would benefit from a transcranial Doppler bubble study.  I think we also need to be vigilant with a followup CT chest to make sure that her other infiltrates have not returned.  This may indicate a waxing and waning inflammatory lung disease.

## 2013-04-06 NOTE — Patient Instructions (Signed)
Would stay on oxygen 24hrs/day for now Stop prednisone, but please call me if increased pulmonary symptoms so when can recheck ct chest and autoimmune panel. Will schedule for bubble study to evaluate you for shunting.  Will call once results of bubble study is available.

## 2013-04-06 NOTE — Assessment & Plan Note (Addendum)
The patient has a history of an abnormal CT chest that he improved over 2 months without specific treatment.  However, the patient was recently started on low dose steroids, and her daughter thinks her breathing has improved since being on this.  She had no airflow obstruction on her PFTs in the past, and it makes me question whether her patchy infiltrates earlier in the year on CT chest were related to BOOP.  This would certainly raise the question of an autoimmune process, and she does have crackles on exam today.  I would like to discontinue her prednisone currently, and if she has acute worsening of her breathing, will need a repeat CT chest with high-resolution cuts, and also an autoimmune panel sent.

## 2013-04-06 NOTE — Progress Notes (Signed)
  Subjective:    Patient ID: Ann Beasley, female    DOB: 03-09-1932, 79 y.o.   MRN: [id number]  HPI The patient comes in today for followup after her recent right heart catheterization.  She has chronic respiratory failure and hypoxemia that is oxygen dependent of unknown origin.  Her echocardiogram had a very poor acoustic window, and therefore could not evaluate the left ventricle or pulmonary pressures.  She subsequently underwent a right heart catheterization which showed normal left-sided pressures, and minimally elevated pulmonary artery pressures.  I have reviewed the test with her in detail, and answered all of her questions.   Review of Systems  Constitutional: Negative for fever and unexpected weight change.  HENT: Negative for congestion, dental problem, ear pain, nosebleeds, postnasal drip, rhinorrhea, sinus pressure, sneezing, sore throat and trouble swallowing.   Eyes: Negative for redness and itching.  Respiratory: Negative for cough, chest tightness, shortness of breath and wheezing.   Cardiovascular: Negative for palpitations and leg swelling.  Gastrointestinal: Negative for nausea and vomiting.  Genitourinary: Negative for dysuria.  Musculoskeletal: Negative for joint swelling.  Skin: Negative for rash.  Neurological: Negative for headaches.  Hematological: Does not bruise/bleed easily.  Psychiatric/Behavioral: Negative for dysphoric mood. The patient is not nervous/anxious.        Objective:   Physical Exam Obese female in no acute distress Nose without purulence or discharge noted Neck without lymphadenopathy or thyromegaly Chest with basilar crackles, but no wheezes Cardiac exam with regular rate and rhythm Lower extremities without significant edema, no cyanosis Alert and oriented, moves all 4 extremities.       Assessment & Plan:

## 2013-04-10 ENCOUNTER — Ambulatory Visit: Payer: Medicare Other | Admitting: Pulmonary Disease

## 2013-04-17 ENCOUNTER — Other Ambulatory Visit: Payer: Self-pay | Admitting: Neurology

## 2013-04-17 DIAGNOSIS — R0609 Other forms of dyspnea: Secondary | ICD-10-CM

## 2013-05-04 ENCOUNTER — Ambulatory Visit (INDEPENDENT_AMBULATORY_CARE_PROVIDER_SITE_OTHER): Payer: Medicare Other | Admitting: Neurology

## 2013-05-04 ENCOUNTER — Ambulatory Visit (INDEPENDENT_AMBULATORY_CARE_PROVIDER_SITE_OTHER): Payer: Medicare Other

## 2013-05-04 VITALS — BP 176/82 | HR 76

## 2013-05-04 DIAGNOSIS — Z0289 Encounter for other administrative examinations: Secondary | ICD-10-CM

## 2013-05-04 DIAGNOSIS — I6789 Other cerebrovascular disease: Secondary | ICD-10-CM

## 2013-05-04 DIAGNOSIS — R0609 Other forms of dyspnea: Secondary | ICD-10-CM

## 2013-05-04 NOTE — Progress Notes (Signed)
[facility] [address] [place]. [place] [address] [phone]       OFFICE CONSULT NOTE  [name] Date of Birth:  Dec 01, 1931 Medical Record Number:  [id number]   Referring MD:  Marcelyn Bruins, MD Reason for Referral:  TCD bubble study to evaluate for right to left shunt HPI: Ann Beasley is a 79 year old pleasant Caucasian lady who had a history of a small stroke 5 years ago. She is unable to provide details due to mild memory loss. She apparently met full neurological recovery and has been taking aspirin 81 mg daily. She also has vascular structures of hypertension and hyperlipidemia and she is on medications for both and they seem to be well controlled though but blood pressure is elevated slightly in the office today. The patient has been diagnosed with refractory hypoxemia by Dr. Shelle Iron and despite extensive evaluation no definite etiology has been determined. Patient is referred to me to evaluate for possible right-to-left intracardiac or intrapulmonary shunt. The patient denies any recent neurovascular symptoms in the form of slurred speech, visual loss, focal extremity weakness, numbness, gait or balance difficulties. She is tolerating aspirin well without significant bleeding or bruising. She has mild memory difficulties and takes Aricept for that but memory loss appears to be stable. She has chronic dizziness which has been evaluated by neurologist Dr. Adella Hare in [place]. I do not have any of his records to review today ROS:   14 system review of systems is positive for dyspnoea, shortness of breath, dizziness and gait difficulty only  PMH:  Past Medical History  Diagnosis Date  . HTN (hypertension)   . Angina at rest     history of  . Asthma   . Hyperlipidemia   . Stroke   . Sleep apnea     Social History:  History   Social History  . Marital Status: Married    Spouse Name: N/A    Number of Children: N/A  . Years of Education: N/A    Occupational History  . retired    Social History Main Topics  . Smoking status: Never Smoker   . Smokeless tobacco: Not on file  . Alcohol Use: No  . Drug Use: No  . Sexual Activity: Not on file   Other Topics Concern  . Not on file   Social History Narrative  . No narrative on file    Medications:   Current Outpatient Prescriptions on File Prior to Visit  Medication Sig Dispense Refill  . albuterol (PROVENTIL HFA;VENTOLIN HFA) 108 (90 BASE) MCG/ACT inhaler Inhale into the lungs every 6 (six) hours as needed for wheezing.      Marland Kitchen albuterol (PROVENTIL) (2.5 MG/3ML) 0.083% nebulizer solution Take 2.5 mg by nebulization every 6 (six) hours as needed for wheezing or shortness of breath.      Marland Kitchen amLODipine (NORVASC) 2.5 MG tablet Take 2.5 mg by mouth daily.      Marland Kitchen aspirin EC 81 MG tablet Take 81 mg by mouth daily.      . carvedilol (COREG) 6.25 MG tablet Take 6.25 mg by mouth 2 (two) times daily with a meal.      . donepezil (ARICEPT) 10 MG tablet Take 10 mg by mouth at bedtime.      . ergocalciferol (VITAMIN D2) 50000 UNITS capsule Take 50,000 Units by mouth once a week. Monday      . FERROUS SULFATE PO Take 1 tablet by mouth daily.      . Fluticasone-Salmeterol (  ADVAIR) 100-50 MCG/DOSE AEPB Inhale 1 puff into the lungs every 12 (twelve) hours.      . furosemide (LASIX) 20 MG tablet Take 20 mg by mouth every Monday, Wednesday, and Friday.      . gabapentin (NEURONTIN) 300 MG capsule Take 300 mg by mouth 2 (two) times daily.      . hydrALAZINE (APRESOLINE) 50 MG tablet Take 50 mg by mouth 2 (two) times daily.      Marland Kitchen losartan (COZAAR) 100 MG tablet Take 100 mg by mouth daily.      . meclizine (ANTIVERT) 25 MG tablet Take 25 mg by mouth 3 (three) times daily as needed for dizziness.       . montelukast (SINGULAIR) 10 MG tablet Take 10 mg by mouth at bedtime.      . Multiple Vitamin (MULTIVITAMIN WITH MINERALS) TABS tablet Take 1 tablet by mouth daily.      . Multiple Vitamins-Minerals  (MULTIVITAMIN WITH MINERALS) tablet Take 1 tablet by mouth daily.      . pravastatin (PRAVACHOL) 20 MG tablet Take 20 mg by mouth every evening.       . predniSONE (DELTASONE) 2.5 MG tablet Take 2.5 mg by mouth daily.       No current facility-administered medications on file prior to visit.    Allergies:  No Known Allergies  Physical Exam General:elderly Caucasian lady on home oxygen in mild respiratory distress Head: head normocephalic and atraumatic. Orohparynx benign Neck: supple with no carotid or supraclavicular bruits Cardiovascular: regular rate and rhythm, no murmurs Musculoskeletal: no deformity Skin:  no rash/petichiae Vascular:  Normal pulses all extremities Filed Vitals:   05/04/13 1513  BP: 176/82  Pulse: 76    Neurologic Exam Mental Status: Awake and fully alert. Oriented to place and time. Recent and remote memory intact. Attention span, concentration and fund of knowledge appropriate. Mood and affect appropriate.  Cranial Nerves: Fundoscopic exam not done  Pupils equal, briskly reactive to light. Extraocular movements full without nystagmus. Visual fields full to confrontation. Hearing intact. Facial sensation intact. Face, tongue, palate moves normally and symmetrically.  Motor: Normal bulk and tone. Normal strength in all tested extremity muscles. Sensory.: intact to touch and pinprick and vibratory sensation.  Coordination: Rapid alternating movements normal in all extremities. Finger-to-nose and heel-to-shin performed accurately bilaterally. Gait and Station:deferred Reflexes: 1+ and symmetric. Toes downgoing.     ASSESSMENT: 41 year lady with remote h/o stroke with recent refractory hypoxemia being evaluated for right to left shunt as possible etiology. She also has chronic dizziness and mild cognitive impairment    PLAN:  Continue aspirin for stroke prevention and strict control of hypertension with blood pressure goal below 130/90 and lipids with LDL  cholesterol goal below 100 mg percent. Transcanal Doppler bubble study would be appropriate to evaluate for right-to-left intracardiac or intrapulmonary shunt.       [facility] [address] [place]. [place] [address]. [phone]       TRANSCRANIAL DOPPLER BUBBLE STUDY   Ann Beasley Date of Birth:  11-05-1931 Medical Record Number:  [id number]   Indications: Diagnostic Date of Procedure: 05/04/13 Clinical History:  55 year lady with h/o stroke being evaluated for right to left shunt Technical Description:   Transcranial Doppler Bubble Study was performed at the bedside after taking written informed consent from the patient and explaining risk/benefits. Both middle cerebral arteries were insonated using a headset. And IV line was inserted in the right wrist by the  RN using aseptic precautions. Agitated saline injection at rest and after valsalva maneuver did not result in high intensity transient signals (HITS).   Impression: Negative Transcranial Doppler Bubble Study indicative/not indicative of right to left intracardiac shunt. The study is slightly suboptimal as the IV extravasated at the time of the  Injection with valsalva manouvre.  Results were explained to the patient. Questions were answered. No further followup appointment is necessary with me. If patient and family choose to see me for stroke or memory loss they may so with a new referral.

## 2013-05-04 NOTE — Patient Instructions (Signed)
The TCD bubble study was negative and did not show any evidence of right to left intracardiac or intrapulmonary shunt. She was advised to follow up with her pulmonologist for further instructions. No further followup is necessary with me. She may be referred back with a new referral for stroke or memory loss in the future if so desired.

## 2013-05-05 NOTE — Progress Notes (Signed)
Ann Beasley, please let pt know her bubble study did not show a shunt, but is not 100%.  Still cannot explain her oxygen issues, and will follow her for now off prednisone and see what happens.  Would like to see her back in 4mos if she doesn't have a f/u apptm, but call if she feels her breathing is getting worse.

## 2013-05-07 NOTE — Progress Notes (Signed)
Pt aware of results of Bubble Study.  Aware to contact our office ASAP if any increased breathing issues.  Pt verified that she is currently off the Prednisone. Appt made for June 05, 2013 @ 430. Appt print out to be mailed to pt home per request.   Nothing further needed.

## 2013-06-05 ENCOUNTER — Ambulatory Visit (INDEPENDENT_AMBULATORY_CARE_PROVIDER_SITE_OTHER): Payer: Medicare HMO | Admitting: Pulmonary Disease

## 2013-06-05 ENCOUNTER — Encounter: Payer: Self-pay | Admitting: Pulmonary Disease

## 2013-06-05 VITALS — BP 138/72 | HR 78 | Temp 98.4°F | Ht 61.0 in | Wt 181.0 lb

## 2013-06-05 DIAGNOSIS — R9389 Abnormal findings on diagnostic imaging of other specified body structures: Secondary | ICD-10-CM

## 2013-06-05 DIAGNOSIS — R0902 Hypoxemia: Secondary | ICD-10-CM

## 2013-06-05 DIAGNOSIS — R0600 Dyspnea, unspecified: Secondary | ICD-10-CM

## 2013-06-05 DIAGNOSIS — J9610 Chronic respiratory failure, unspecified whether with hypoxia or hypercapnia: Secondary | ICD-10-CM

## 2013-06-05 DIAGNOSIS — R0609 Other forms of dyspnea: Secondary | ICD-10-CM

## 2013-06-05 DIAGNOSIS — R0989 Other specified symptoms and signs involving the circulatory and respiratory systems: Secondary | ICD-10-CM

## 2013-06-05 NOTE — Assessment & Plan Note (Signed)
The patient has mild pulmonary infiltrates that appear to be steroid responsive. It is unclear, to more disease she may have on a microscopic level. I am very hesitant to do a lung biopsy given her age and frailty, and I would be willing to keep her on 5-10 mg of prednisone a day without a biopsy if this controls her symptoms and infiltrates. It had a long discussion with her and her family member about this, and they are in agreement. It is unclear how much this is really contributing to her chronic hypoxemia versus an unidentified shunt process.

## 2013-06-05 NOTE — Patient Instructions (Signed)
Stay off prednisone for now, and will check autoimmune panel here in 2 weeks.  If you have worsening breathing issues in the interim, please call us Once bloodwork is drawn, will start you on prednisone 10mg  a day, and try to get you to 5mg  a day Will see if you qualify for a portable concentrator. followup with me in 8 weeks.

## 2013-06-05 NOTE — Addendum Note (Signed)
Addended by: Virl Cagey on: 06/05/2013 05:29 PM   Modules accepted: Orders

## 2013-06-05 NOTE — Progress Notes (Signed)
   Subjective:    Patient ID: Ann Beasley, female    DOB: 01/25/1932, 80 y.o.   MRN: [id number]  HPI The patient comes in today for followup of her hypoxemia unknown origin, as well as history of patchy pulmonary densities. She remains oxygen dependent, and workup today for a shunt and pulmonary hypertension have all been unrevealing. She did seem to have some improvement in her breathing with a course of prednisone, and her patchy infiltrates on x-ray did resolved as well. The question has been raised whether she may have BOOP. She recently developed worsening shortness of breath off the prednisone, and was treated with a ten-day course by her primary physician. She had definite improvement in her breathing, and has been off prednisone now for almost 2 weeks.   Review of Systems  Constitutional: Negative for fever and unexpected weight change.  HENT: Negative for congestion, dental problem, ear pain, nosebleeds, postnasal drip, rhinorrhea, sinus pressure, sneezing, sore throat and trouble swallowing.   Eyes: Negative for redness and itching.  Respiratory: Positive for shortness of breath ( recently). Negative for cough, chest tightness and wheezing.   Cardiovascular: Negative for palpitations and leg swelling.  Gastrointestinal: Negative for nausea and vomiting.  Genitourinary: Negative for dysuria.  Musculoskeletal: Negative for joint swelling.  Skin: Negative for rash.  Neurological: Negative for headaches.  Hematological: Does not bruise/bleed easily.  Psychiatric/Behavioral: Negative for dysphoric mood. The patient is not nervous/anxious.        Objective:   Physical Exam Overweight female in no acute distress Nose without purulence or discharge noted Neck without lymphadenopathy or thyromegaly Chest with a few crackles in the left base, otherwise clear Cardiac exam with regular rate and rhythm Lower extremities with mild ankle edema, no cyanosis Alert and oriented, moves  all 4 extremities.       Assessment & Plan:

## 2013-06-29 ENCOUNTER — Ambulatory Visit (INDEPENDENT_AMBULATORY_CARE_PROVIDER_SITE_OTHER): Payer: Medicare HMO

## 2013-06-29 DIAGNOSIS — R0989 Other specified symptoms and signs involving the circulatory and respiratory systems: Secondary | ICD-10-CM

## 2013-06-29 DIAGNOSIS — R0609 Other forms of dyspnea: Secondary | ICD-10-CM

## 2013-06-29 DIAGNOSIS — R0600 Dyspnea, unspecified: Secondary | ICD-10-CM

## 2013-06-29 LAB — C-REACTIVE PROTEIN: CRP: 1 mg/dL (ref 0.5–20.0)

## 2013-06-29 LAB — RHEUMATOID FACTOR: RHEUMATOID FACTOR: 183 [IU]/mL — AB (ref <=14)

## 2013-06-29 LAB — SEDIMENTATION RATE: SED RATE: 107 mm/h — AB (ref 0–22)

## 2013-06-30 LAB — CK TOTAL AND CKMB (NOT AT ARMC)
CK, MB: <0.7 ng/mL (ref 0.0–5.0)
Total CK: 34 U/L (ref 7–177)

## 2013-07-02 LAB — ANTI-NUCLEAR AB-TITER (ANA TITER)

## 2013-07-02 LAB — ANA: Anti Nuclear Antibody(ANA): POSITIVE — AB

## 2013-07-02 LAB — JO-1 ANTIBODY-IGG: JO-1 ANTIBODY, IGG: NEGATIVE

## 2013-07-02 LAB — CYCLIC CITRUL PEPTIDE ANTIBODY, IGG: Cyclic Citrullin Peptide Ab: <2 U/mL (ref 0.0–5.0)

## 2013-07-02 LAB — ANTI-SCLERODERMA ANTIBODY: SCLERODERMA (SCL-70) (ENA) ANTIBODY, IGG: NEGATIVE

## 2013-07-03 ENCOUNTER — Telehealth (HOSPITAL_COMMUNITY): Payer: Self-pay | Admitting: Cardiology

## 2013-07-03 ENCOUNTER — Encounter (HOSPITAL_COMMUNITY): Payer: Self-pay | Admitting: Cardiology

## 2013-07-03 NOTE — Telephone Encounter (Signed)
Attempting to schedule 3 month follow up I have been unable to reach this patient by phone.  A letter is being sent to the last known home address.  

## 2013-07-05 ENCOUNTER — Other Ambulatory Visit: Payer: Self-pay | Admitting: Pulmonary Disease

## 2013-07-05 DIAGNOSIS — R0600 Dyspnea, unspecified: Secondary | ICD-10-CM

## 2013-07-05 DIAGNOSIS — R9389 Abnormal findings on diagnostic imaging of other specified body structures: Secondary | ICD-10-CM

## 2013-07-05 LAB — RNP ANTIBODY: Ribonucleic Protein(ENA) Antibody, IgG: <1 AI

## 2013-07-11 ENCOUNTER — Telehealth: Payer: Self-pay | Admitting: Pulmonary Disease

## 2013-07-11 MED ORDER — PREDNISONE 10 MG PO TABS: 10.0000 mg | ORAL_TABLET | Freq: Every day | ORAL | Status: DC

## 2013-07-11 NOTE — Telephone Encounter (Signed)
She is supposed to be on 10mg  a day, and lets make sure the rheum referral is getting taken care of.

## 2013-07-11 NOTE — Telephone Encounter (Signed)
Pt's daughter is aware that we will send in the rx for prednisone. Rheumatology referral is being worked on due the pt having [employer]. Once the PCP office faxes everything to the [facility] the appointment will be made.

## 2013-07-11 NOTE — Telephone Encounter (Signed)
Per lab done in Jan: Notes Recorded by Virl Cagey, CMA on 07/03/2013 at 3:49 PM Results have been explained to patient, pt expressed understanding. Nothing further needed. Patient to start Prednisone 10mg  qd until told otherwise to taper dose.  Patient aware that we will contact her once all final results are received. ------ Notes Recorded by Kathee Delton, MD on 07/02/2013 at 6:08 PM Please let pt know that her autoimmune labs are very abnormal. Once all the results return, will consider getting her to a rheumatologist for evaluation. Stay on prednisone as we discussed -------------  Spoke w/ Sharyn Lull. Pt is needing a refill on prednisone. She only has pred 2 mg tablets and this is what she has been taking. She has not been taking pred 10 mg bc we didn't;t call this in per daughter. Also pt has not seen rheumatology yet. Looks like waiting on silver back referral and will forward to PCC's to check on status of appt. Please advise [place] and PCC's thanks

## 2013-07-12 ENCOUNTER — Telehealth: Payer: Self-pay | Admitting: Pulmonary Disease

## 2013-07-12 DIAGNOSIS — R0600 Dyspnea, unspecified: Secondary | ICD-10-CM

## 2013-07-12 DIAGNOSIS — J9610 Chronic respiratory failure, unspecified whether with hypoxia or hypercapnia: Secondary | ICD-10-CM

## 2013-07-12 NOTE — Telephone Encounter (Signed)
ashtyn have you received any papers on this pt from [facility]?  thanks

## 2013-07-13 NOTE — Telephone Encounter (Signed)
Spoke daughter-- Pt having to switch DME d/t insurance company. Order has been placed as seen below to [facility].  Transitioning from [facility] to [place] for O2 d/t insurance. Huey Romans is requesting a Rx for portable O2 concentro & her portable O2.  Would like to know if pt will be a candidate for a conserving device on the O2.  Fax # is [phone] note pt transitioning

## 2013-07-31 ENCOUNTER — Ambulatory Visit (INDEPENDENT_AMBULATORY_CARE_PROVIDER_SITE_OTHER): Payer: Medicare HMO | Admitting: Pulmonary Disease

## 2013-07-31 ENCOUNTER — Telehealth: Payer: Self-pay | Admitting: Pulmonary Disease

## 2013-07-31 ENCOUNTER — Encounter: Payer: Self-pay | Admitting: Pulmonary Disease

## 2013-07-31 VITALS — BP 130/70 | HR 66 | Temp 98.0°F | Ht 61.0 in | Wt 186.6 lb

## 2013-07-31 DIAGNOSIS — R918 Other nonspecific abnormal finding of lung field: Secondary | ICD-10-CM

## 2013-07-31 DIAGNOSIS — R0600 Dyspnea, unspecified: Secondary | ICD-10-CM

## 2013-07-31 DIAGNOSIS — R0989 Other specified symptoms and signs involving the circulatory and respiratory systems: Secondary | ICD-10-CM

## 2013-07-31 DIAGNOSIS — R0609 Other forms of dyspnea: Secondary | ICD-10-CM

## 2013-07-31 DIAGNOSIS — J9610 Chronic respiratory failure, unspecified whether with hypoxia or hypercapnia: Secondary | ICD-10-CM

## 2013-07-31 NOTE — Progress Notes (Signed)
   Subjective:    Patient ID: Ann Beasley, female    DOB: [birth year], 80 y.o.   MRN: [id number]  HPI Patient comes in today for followup of her steroid responsive pulmonary infiltrates. At the last visit, she was found to have an abnormal rheumatoid factor, sedimentation rate, and also ANA. She has been referred to rheumatology for evaluation, however I have not received information so far. The patient tells me that additional blood work was sent, and she has a followup visit scheduled. She tells me that her breathing is much improved on the low-dose prednisone, and her oxygen needs have decreased as well. She denies any significant cough or congestion.   Review of Systems  Constitutional: Negative for fever and unexpected weight change.  HENT: Negative for congestion, dental problem, ear pain, nosebleeds, postnasal drip, rhinorrhea, sinus pressure, sneezing, sore throat and trouble swallowing.   Eyes: Negative for redness and itching.  Respiratory: Negative for cough, chest tightness, shortness of breath and wheezing.   Cardiovascular: Negative for palpitations and leg swelling.  Gastrointestinal: Negative for nausea and vomiting.  Genitourinary: Negative for dysuria.  Musculoskeletal: Negative for joint swelling.  Skin: Negative for rash.  Neurological: Negative for headaches.  Hematological: Does not bruise/bleed easily.  Psychiatric/Behavioral: Negative for dysphoric mood. The patient is not nervous/anxious.        Objective:   Physical Exam Overweight female in no acute distress Nose without purulence or discharge noted Neck without lymphadenopathy or thyromegaly Chest totally clear to auscultation, no wheezing  Cardiac exam with regular rate and rhythm Lower extremities with mild ankle edema, no cyanosis Alert and oriented, moves all 4 extremities.       Assessment & Plan:

## 2013-07-31 NOTE — Telephone Encounter (Signed)
Make sure we get the rheum notes on this pt from [facility] medical.

## 2013-07-31 NOTE — Assessment & Plan Note (Addendum)
CT 11/2012: small right effusion, RUL pleural based lesion 2.0 x 1.6, LLL pleural based lesion 1.5 x 1.1.  CT chest 01/2013:  Improved subpleural opacities, 59mm nodular opacity minor fissure that will need to be followed.  ++prednisone responsive ESR 107, RF 183, ANA 1:2560 homogeneous   The patient has done very well on low-dose prednisone, with clearing of her chest x-ray at the last visit. She continues to do better from a breathing standpoint, with decreased oxygen needs. I have asked her to stay on prednisone at 10 mg a day for now is that she is doing so well, and will await the recommendations from her rheumatologist. I have also stressed to her the importance of aggressive weight loss and conditioning.

## 2013-07-31 NOTE — Telephone Encounter (Signed)
These have been received and given to [facility] to review.  Nothing further needed.

## 2013-07-31 NOTE — Patient Instructions (Signed)
Will leave your on prednisone 10mg  a day since you are doing so well.  Will await the recommendation from your rheumatologist. Work on weight loss and conditioning, stay on your portable oxygen followup with me again in 20mos, but call if having breathing issues.

## 2013-10-31 ENCOUNTER — Ambulatory Visit: Payer: Medicare HMO | Admitting: Pulmonary Disease

## 2013-11-16 ENCOUNTER — Ambulatory Visit: Payer: Commercial Managed Care - HMO | Admitting: Pulmonary Disease

## 2013-12-07 ENCOUNTER — Ambulatory Visit: Payer: Commercial Managed Care - HMO | Admitting: Pulmonary Disease

## 2014-01-01 ENCOUNTER — Encounter: Payer: Self-pay | Admitting: Pulmonary Disease

## 2014-01-01 ENCOUNTER — Ambulatory Visit (INDEPENDENT_AMBULATORY_CARE_PROVIDER_SITE_OTHER): Payer: Commercial Managed Care - HMO | Admitting: Pulmonary Disease

## 2014-01-01 VITALS — BP 140/80 | HR 73 | Ht 61.0 in | Wt 197.0 lb

## 2014-01-01 DIAGNOSIS — R0600 Dyspnea, unspecified: Secondary | ICD-10-CM

## 2014-01-01 DIAGNOSIS — R0902 Hypoxemia: Secondary | ICD-10-CM

## 2014-01-01 DIAGNOSIS — R918 Other nonspecific abnormal finding of lung field: Secondary | ICD-10-CM

## 2014-01-01 DIAGNOSIS — R0989 Other specified symptoms and signs involving the circulatory and respiratory systems: Secondary | ICD-10-CM

## 2014-01-01 DIAGNOSIS — J9610 Chronic respiratory failure, unspecified whether with hypoxia or hypercapnia: Secondary | ICD-10-CM

## 2014-01-01 DIAGNOSIS — J9611 Chronic respiratory failure with hypoxia: Secondary | ICD-10-CM

## 2014-01-01 DIAGNOSIS — R0609 Other forms of dyspnea: Secondary | ICD-10-CM

## 2014-01-01 MED ORDER — SULFAMETHOXAZOLE-TMP DS 800-160 MG PO TABS: 1.0000 | ORAL_TABLET | ORAL | Status: DC

## 2014-01-01 NOTE — Patient Instructions (Signed)
No change in medications Stay on your oxygen with activity and sleep, and check your oxygen level at rest periodically off oxygen.  If level 88% or less, need to stay on oxygen at rest. Work on staying active and weight loss. Will start on bactrim ds, take one tab 3 days a week while on the imuran. followup with me again in 107mos.

## 2014-01-01 NOTE — Progress Notes (Signed)
   Subjective:    Patient ID: Ann Beasley, female    DOB: Jun 23, 1931, 80 y.o.   MRN: [id number]  HPI The patient comes in today for followup of her pulmonary infiltrates with chronic respiratory failure. These are felt to be secondary to some type of autoimmune inflammatory process, in light of the patient's elevated ANA, rheumatoid factor, and now an elevated pANCA. She had been weaned down off prednisone and was doing very well, but rheumatology felt she needed to be on some type of immunosuppressive agent such as Imuran given her elevated p-ANCA titer. The patient feels that she has been doing well from a pulmonary standpoint, and is unsure if she needs to be on oxygen at this point. She denies any significant cough or mucus production. She continues to have some dyspnea on exertion related to her obesity and deconditioning.   Review of Systems  Constitutional: Negative for fever and unexpected weight change.  HENT: Positive for trouble swallowing. Negative for congestion, dental problem, ear pain, nosebleeds, postnasal drip, rhinorrhea, sinus pressure, sneezing and sore throat.   Eyes: Negative for redness and itching.  Respiratory: Negative for cough, chest tightness, shortness of breath and wheezing.   Cardiovascular: Negative for palpitations and leg swelling.  Gastrointestinal: Negative for nausea and vomiting.  Genitourinary: Negative for dysuria.  Musculoskeletal: Negative for joint swelling.  Skin: Negative for rash.  Neurological: Negative for headaches.  Hematological: Does not bruise/bleed easily.  Psychiatric/Behavioral: Negative for dysphoric mood. The patient is not nervous/anxious.        Objective:   Physical Exam Obese female in no acute distress Nose without purulence or discharge noted Neck without lymphadenopathy or thyromegaly Chest with minimal basilar crackles, no wheezing Cardiac exam with regular rate and rhythm Lower extremities with mild edema, no  cyanosis Alert and oriented, moves all 4 extremities.       Assessment & Plan:

## 2014-01-01 NOTE — Assessment & Plan Note (Signed)
The patient appears to be doing very well on Imuran with no significant side effects. She feels that her breathing has improved somewhat over time, and is unsure if she really needs to be on the oxygen. I have stressed to her the importance of aggressive weight loss and conditioning in order to improve her exertional tolerance. I will also add PCP prophylaxis 3 days a week since she is on Imuran. At some point in the near future, I would like to reevaluate her chest with either an x-ray or CT scan.

## 2014-02-15 ENCOUNTER — Other Ambulatory Visit (HOSPITAL_COMMUNITY)
Admission: RE | Admit: 2014-02-15 | Disposition: A | Discharge status: Home / Self Care | Payer: Medicare HMO | Source: Ambulatory Visit | Attending: Oncology | Admitting: Oncology

## 2014-02-15 LAB — BONE MARROW EXAM

## 2014-02-25 LAB — CHROMOSOME ANALYSIS, BONE MARROW

## 2014-03-15 ENCOUNTER — Other Ambulatory Visit: Payer: Self-pay

## 2014-03-26 ENCOUNTER — Encounter (HOSPITAL_COMMUNITY): Payer: Self-pay

## 2014-05-03 ENCOUNTER — Ambulatory Visit (INDEPENDENT_AMBULATORY_CARE_PROVIDER_SITE_OTHER)
Admission: RE | Admit: 2014-05-03 | Discharge: 2014-05-03 | Disposition: A | Discharge status: Home / Self Care | Payer: Commercial Managed Care - HMO | Source: Ambulatory Visit | Attending: Pulmonary Disease | Admitting: Pulmonary Disease

## 2014-05-03 ENCOUNTER — Encounter: Payer: Self-pay | Admitting: Pulmonary Disease

## 2014-05-03 ENCOUNTER — Ambulatory Visit (INDEPENDENT_AMBULATORY_CARE_PROVIDER_SITE_OTHER): Payer: Commercial Managed Care - HMO | Admitting: Pulmonary Disease

## 2014-05-03 VITALS — BP 118/62 | HR 70 | Temp 97.7°F | Ht 61.0 in | Wt 192.6 lb

## 2014-05-03 DIAGNOSIS — J9611 Chronic respiratory failure with hypoxia: Secondary | ICD-10-CM

## 2014-05-03 DIAGNOSIS — R0600 Dyspnea, unspecified: Secondary | ICD-10-CM

## 2014-05-03 DIAGNOSIS — R918 Other nonspecific abnormal finding of lung field: Secondary | ICD-10-CM

## 2014-05-03 DIAGNOSIS — Z8709 Personal history of other diseases of the respiratory system: Secondary | ICD-10-CM

## 2014-05-03 NOTE — Assessment & Plan Note (Signed)
The patient continues on her immunosuppressive regimen under the direction of rheumatology. She continues to have significant dyspnea on exertion, but I really believe this is secondary to her obesity and deconditioning. Her lung exam today is fairly clear, and we will do a follow-up chest x-ray to check on her history of pulmonary infiltrates.

## 2014-05-03 NOTE — Progress Notes (Signed)
   Subjective:    Patient ID: Ann Beasley, female    DOB: Jun 29, 1931, 80 y.o.   MRN: [id number]  HPI Patient comes in today for follow-up of her dyspnea on exertion, felt primarily secondary to her obesity and deconditioning. She has a history of a pulmonary infiltrate that was felt secondary to some type of vasculitis, and is being treated by rheumatology for underlying autoimmune disease with Imuran. She apparently is tolerating this very well. The patient feels that her exertional tolerance is at baseline, and she does note that her saturations fall when she is not wearing oxygen. She has a history of asthma, but feels this is stable on her maintenance medications. She is having some cough, and based on her history it may be due to postnasal drip.   Review of Systems  Constitutional: Negative for fever and unexpected weight change.  HENT: Positive for congestion. Negative for dental problem, ear pain, nosebleeds, postnasal drip, rhinorrhea, sinus pressure, sneezing, sore throat and trouble swallowing.   Eyes: Negative for redness and itching.  Respiratory: Positive for cough and shortness of breath. Negative for chest tightness and wheezing.   Cardiovascular: Negative for palpitations and leg swelling.  Gastrointestinal: Negative for nausea and vomiting.  Genitourinary: Negative for dysuria.  Musculoskeletal: Negative for joint swelling.  Skin: Negative for rash.  Neurological: Negative for headaches.  Hematological: Does not bruise/bleed easily.  Psychiatric/Behavioral: Negative for dysphoric mood. The patient is not nervous/anxious.        Objective:   Physical Exam Obese female in no acute distress Nose without purulence or discharge noted Neck without lymphadenopathy or thyromegaly Chest fairly clear, minimal left basilar crackles. Cardiac exam with regular rate and rhythm Lower extremities with mild edema, no cyanosis Alert and oriented, moves all 4 extremities.      Assessment & Plan:

## 2014-05-03 NOTE — Patient Instructions (Signed)
Will check chest xray today, and call with results Work on your exercises as we discussed. Get chlorpheniramine 4mg  OTC, and take 2 at bedtime and one at lunch for the next 2 weeks to see if cough improves.  followup with me again in 62mos

## 2014-05-03 NOTE — Addendum Note (Signed)
Addended by: Inge Rise on: 05/03/2014 11:57 AM   Modules accepted: Orders

## 2014-05-09 ENCOUNTER — Encounter (HOSPITAL_COMMUNITY): Payer: Self-pay | Admitting: Internal Medicine

## 2014-11-06 ENCOUNTER — Ambulatory Visit (INDEPENDENT_AMBULATORY_CARE_PROVIDER_SITE_OTHER): Payer: Commercial Managed Care - HMO | Admitting: Pulmonary Disease

## 2014-11-06 ENCOUNTER — Encounter: Payer: Self-pay | Admitting: Pulmonary Disease

## 2014-11-06 VITALS — BP 118/64 | HR 77 | Temp 97.5°F | Ht 61.0 in | Wt 188.6 lb

## 2014-11-06 DIAGNOSIS — R918 Other nonspecific abnormal finding of lung field: Secondary | ICD-10-CM

## 2014-11-06 DIAGNOSIS — R0600 Dyspnea, unspecified: Secondary | ICD-10-CM | POA: Diagnosis not present

## 2014-11-06 DIAGNOSIS — J9611 Chronic respiratory failure with hypoxia: Secondary | ICD-10-CM

## 2014-11-06 NOTE — Assessment & Plan Note (Addendum)
The patient feels that her dyspnea on exertion is much improved, and she has not been wearing her oxygen with exertional activities. I have stressed to her the importance of staying active, and also working on weight reduction. We have never been able to totally explain why she has her dyspnea and desaturation, but I have blamed this on her weight and conditioning, as well as her underlying probable vasculitis. She has clearly improved since being on immunosuppressive medication.  I have never felt this was related to her history of asthma.

## 2014-11-06 NOTE — Assessment & Plan Note (Signed)
The patient has a history of pulmonary infiltrates with lab work in history that is suggestive of a vasculitis. She is being treated with Imuran by rheumatology, and saw a significant improvement in her chest x-ray abnormalities after being on immunosuppression. Would recommend that we check a chest x-ray periodically.

## 2014-11-06 NOTE — Patient Instructions (Signed)
Continue on oxygen with sleep, but do not need with exertion or rest. Keep active and build up leg muscles.  Also work on weight loss. followup with Dr. Lamonte Sakai in 42mos, and would do followup chest xray the same day.

## 2014-11-06 NOTE — Progress Notes (Signed)
   Subjective:    Patient ID: Ann Beasley, female    DOB: 1931-06-16, 81 y.o.   MRN: [id number]  HPI The patient comes in today for follow-up of her multifactorial dyspnea on exertion. She also has a history of an abnormal chest x-ray, and was felt to have a vasculitis process. Nicotine improvement in her x-ray and also her symptoms with initiation of Imuran by her rheumatologist. She has also felt to have dyspnea associate with her morbid obesity and deconditioning. She comes in today where she is doing very well. He is been trying to stay active, and does not feel that she needs exertional oxygen. She denies any significant cough or mucus production.   Review of Systems  Constitutional: Negative for fever and unexpected weight change.  HENT: Negative for congestion, dental problem, ear pain, nosebleeds, postnasal drip, rhinorrhea, sinus pressure, sneezing, sore throat and trouble swallowing.   Eyes: Negative for redness and itching.  Respiratory: Positive for shortness of breath. Negative for cough, chest tightness and wheezing.   Cardiovascular: Negative for palpitations and leg swelling.  Gastrointestinal: Negative for nausea and vomiting.  Genitourinary: Negative for dysuria.  Musculoskeletal: Negative for joint swelling.  Skin: Negative for rash.  Neurological: Negative for headaches.  Hematological: Does not bruise/bleed easily.  Psychiatric/Behavioral: Negative for dysphoric mood. The patient is not nervous/anxious.        Objective:   Physical Exam Overweight female in no acute distress Nose without purulence or discharge noted Neck without lymphadenopathy or thyromegaly Chest with very mild basilar crackles, otherwise clear Cor with rrr, 1/6 sem LE with mild edema, no cyanosis Alert and oriented, moves all 4.        Assessment & Plan:

## 2014-11-08 ENCOUNTER — Ambulatory Visit: Payer: Commercial Managed Care - HMO | Admitting: Pulmonary Disease

## 2014-11-25 ENCOUNTER — Other Ambulatory Visit: Payer: Self-pay

## 2015-06-17 DIAGNOSIS — D539 Nutritional anemia, unspecified: Secondary | ICD-10-CM | POA: Diagnosis not present

## 2015-06-17 DIAGNOSIS — D72820 Lymphocytosis (symptomatic): Secondary | ICD-10-CM | POA: Diagnosis not present

## 2015-08-26 ENCOUNTER — Observation Stay (HOSPITAL_BASED_OUTPATIENT_CLINIC_OR_DEPARTMENT_OTHER)
Admission: EM | Admit: 2015-08-26 | Discharge: 2015-08-28 | Disposition: A | Discharge status: Home / Self Care | Payer: Commercial Managed Care - HMO | Source: Home / Self Care | Attending: Emergency Medicine | Admitting: Emergency Medicine

## 2015-08-26 ENCOUNTER — Encounter (HOSPITAL_COMMUNITY): Payer: Self-pay | Admitting: Emergency Medicine

## 2015-08-26 ENCOUNTER — Emergency Department (HOSPITAL_COMMUNITY): Payer: Commercial Managed Care - HMO

## 2015-08-26 DIAGNOSIS — E785 Hyperlipidemia, unspecified: Secondary | ICD-10-CM

## 2015-08-26 DIAGNOSIS — J9611 Chronic respiratory failure with hypoxia: Secondary | ICD-10-CM

## 2015-08-26 DIAGNOSIS — R5381 Other malaise: Secondary | ICD-10-CM | POA: Insufficient documentation

## 2015-08-26 DIAGNOSIS — G4733 Obstructive sleep apnea (adult) (pediatric): Secondary | ICD-10-CM

## 2015-08-26 DIAGNOSIS — R404 Transient alteration of awareness: Secondary | ICD-10-CM

## 2015-08-26 DIAGNOSIS — E669 Obesity, unspecified: Secondary | ICD-10-CM

## 2015-08-26 DIAGNOSIS — C9110 Chronic lymphocytic leukemia of B-cell type not having achieved remission: Secondary | ICD-10-CM

## 2015-08-26 DIAGNOSIS — I350 Nonrheumatic aortic (valve) stenosis: Secondary | ICD-10-CM | POA: Insufficient documentation

## 2015-08-26 DIAGNOSIS — Z7982 Long term (current) use of aspirin: Secondary | ICD-10-CM | POA: Insufficient documentation

## 2015-08-26 DIAGNOSIS — F0390 Unspecified dementia without behavioral disturbance: Secondary | ICD-10-CM | POA: Insufficient documentation

## 2015-08-26 DIAGNOSIS — R55 Syncope and collapse: Secondary | ICD-10-CM | POA: Insufficient documentation

## 2015-08-26 DIAGNOSIS — Z8673 Personal history of transient ischemic attack (TIA), and cerebral infarction without residual deficits: Secondary | ICD-10-CM

## 2015-08-26 DIAGNOSIS — R4189 Other symptoms and signs involving cognitive functions and awareness: Secondary | ICD-10-CM | POA: Insufficient documentation

## 2015-08-26 DIAGNOSIS — Z9981 Dependence on supplemental oxygen: Secondary | ICD-10-CM | POA: Insufficient documentation

## 2015-08-26 DIAGNOSIS — J111 Influenza due to unidentified influenza virus with other respiratory manifestations: Secondary | ICD-10-CM | POA: Diagnosis present

## 2015-08-26 DIAGNOSIS — E876 Hypokalemia: Secondary | ICD-10-CM | POA: Diagnosis not present

## 2015-08-26 DIAGNOSIS — D61818 Other pancytopenia: Secondary | ICD-10-CM | POA: Insufficient documentation

## 2015-08-26 DIAGNOSIS — I10 Essential (primary) hypertension: Secondary | ICD-10-CM | POA: Diagnosis not present

## 2015-08-26 DIAGNOSIS — R4020 Unspecified coma: Secondary | ICD-10-CM | POA: Insufficient documentation

## 2015-08-26 DIAGNOSIS — R531 Weakness: Secondary | ICD-10-CM | POA: Diagnosis not present

## 2015-08-26 HISTORY — DX: Unspecified osteoarthritis, unspecified site: M19.90

## 2015-08-26 HISTORY — DX: Dependence on supplemental oxygen: Z99.81

## 2015-08-26 HISTORY — DX: Transient cerebral ischemic attack, unspecified: G45.9

## 2015-08-26 HISTORY — DX: Calculus of kidney: N20.0

## 2015-08-26 HISTORY — DX: Personal history of other diseases of the digestive system: Z87.19

## 2015-08-26 HISTORY — DX: Personal history of peptic ulcer disease: Z87.11

## 2015-08-26 HISTORY — DX: Chronic lymphocytic leukemia of B-cell type not having achieved remission: C91.10

## 2015-08-26 LAB — BASIC METABOLIC PANEL
Anion gap: 9 (ref 5–15)
BUN: 19 mg/dL (ref 6–20)
CHLORIDE: 98 mmol/L — AB (ref 101–111)
CO2: 29 mmol/L (ref 22–32)
Calcium: 8.7 mg/dL — ABNORMAL LOW (ref 8.9–10.3)
Creatinine, Ser: 0.93 mg/dL (ref 0.44–1.00)
GFR calc Af Amer: >60 mL/min (ref >60)
GFR, EST NON AFRICAN AMERICAN: 56 mL/min — AB (ref >60)
Glucose, Bld: 138 mg/dL — ABNORMAL HIGH (ref 65–99)
Potassium: 3.1 mmol/L — ABNORMAL LOW (ref 3.5–5.1)
SODIUM: 136 mmol/L (ref 135–145)

## 2015-08-26 LAB — CBC
HCT: 31.6 % — ABNORMAL LOW (ref 36.0–46.0)
HEMATOCRIT: 30.1 % — AB (ref 36.0–46.0)
HEMOGLOBIN: 9.9 g/dL — AB (ref 12.0–15.0)
Hemoglobin: 10.3 g/dL — ABNORMAL LOW (ref 12.0–15.0)
MCH: 31.3 pg (ref 26.0–34.0)
MCH: 31.5 pg (ref 26.0–34.0)
MCHC: 32.6 g/dL (ref 30.0–36.0)
MCHC: 32.9 g/dL (ref 30.0–36.0)
MCV: 96 fL (ref 78.0–100.0)
MCV: 95.9 fL (ref 78.0–100.0)
PLATELETS: 149 10*3/uL — AB (ref 150–400)
Platelets: 148 10*3/uL — ABNORMAL LOW (ref 150–400)
RBC: 3.29 MIL/uL — ABNORMAL LOW (ref 3.87–5.11)
RBC: 3.14 MIL/uL — ABNORMAL LOW (ref 3.87–5.11)
RDW: 14 % (ref 11.5–15.5)
RDW: 14.1 % (ref 11.5–15.5)
WBC: 5 10*3/uL (ref 4.0–10.5)
WBC: 4.5 10*3/uL (ref 4.0–10.5)

## 2015-08-26 LAB — URINE MICROSCOPIC-ADD ON

## 2015-08-26 LAB — URINALYSIS, ROUTINE W REFLEX MICROSCOPIC
GLUCOSE, UA: NEGATIVE mg/dL
HGB URINE DIPSTICK: NEGATIVE
Ketones, ur: NEGATIVE mg/dL
LEUKOCYTES UA: NEGATIVE
Nitrite: NEGATIVE
PROTEIN: 100 mg/dL — AB
Specific Gravity, Urine: 1.027 (ref 1.005–1.030)
pH: 6 (ref 5.0–8.0)

## 2015-08-26 LAB — CREATININE, SERUM
Creatinine, Ser: 0.81 mg/dL (ref 0.44–1.00)
GFR calc Af Amer: >60 mL/min (ref >60)
GFR calc non Af Amer: >60 mL/min (ref >60)

## 2015-08-26 LAB — I-STAT CG4 LACTIC ACID, ED: Lactic Acid, Venous: 1.62 mmol/L (ref 0.5–2.0)

## 2015-08-26 LAB — I-STAT TROPONIN, ED: Troponin i, poc: 0.02 ng/mL (ref 0.00–0.08)

## 2015-08-26 MED ORDER — ACETAMINOPHEN 650 MG RE SUPP: 650.0000 mg | Freq: Four times a day (QID) | RECTAL | Status: DC | PRN

## 2015-08-26 MED ORDER — CARVEDILOL 6.25 MG PO TABS
6.2500 mg | ORAL_TABLET | Freq: Two times a day (BID) | ORAL | Status: DC
  Administered 2015-08-26 – 2015-08-28 (×4): 6.25 mg via ORAL
  Filled 2015-08-26 (×4): qty 1

## 2015-08-26 MED ORDER — OSELTAMIVIR PHOSPHATE 30 MG PO CAPS
30.0000 mg | ORAL_CAPSULE | Freq: Two times a day (BID) | ORAL | Status: DC
  Administered 2015-08-26 – 2015-08-28 (×5): 30 mg via ORAL
  Filled 2015-08-26 (×6): qty 1

## 2015-08-26 MED ORDER — VITAMIN D (ERGOCALCIFEROL) 1.25 MG (50000 UNIT) PO CAPS
50000.0000 [IU] | ORAL_CAPSULE | ORAL | Status: DC
Start: 2015-09-01 — End: 2015-08-29

## 2015-08-26 MED ORDER — MOMETASONE FURO-FORMOTEROL FUM 100-5 MCG/ACT IN AERO
2.0000 | INHALATION_SPRAY | Freq: Two times a day (BID) | RESPIRATORY_TRACT | Status: DC
  Administered 2015-08-26 – 2015-08-28 (×4): 2 via RESPIRATORY_TRACT
  Filled 2015-08-26 (×2): qty 8.8

## 2015-08-26 MED ORDER — SODIUM CHLORIDE 0.9 % IV SOLN
INTRAVENOUS | Status: DC
  Administered 2015-08-26: 12:00:00 via INTRAVENOUS

## 2015-08-26 MED ORDER — AZATHIOPRINE 50 MG PO TABS
50.0000 mg | ORAL_TABLET | Freq: Every day | ORAL | Status: DC
  Administered 2015-08-26 – 2015-08-28 (×3): 50 mg via ORAL
  Filled 2015-08-26 (×3): qty 1

## 2015-08-26 MED ORDER — IPRATROPIUM-ALBUTEROL 0.5-2.5 (3) MG/3ML IN SOLN
3.0000 mL | Freq: Once | RESPIRATORY_TRACT | Status: AC
  Administered 2015-08-26: 3 mL via RESPIRATORY_TRACT
  Filled 2015-08-26: qty 3

## 2015-08-26 MED ORDER — ENOXAPARIN SODIUM 40 MG/0.4ML ~~LOC~~ SOLN
40.0000 mg | SUBCUTANEOUS | Status: DC
  Administered 2015-08-26 – 2015-08-27 (×2): 40 mg via SUBCUTANEOUS
  Filled 2015-08-26 (×2): qty 0.4

## 2015-08-26 MED ORDER — ALBUTEROL SULFATE (2.5 MG/3ML) 0.083% IN NEBU
2.5000 mg | INHALATION_SOLUTION | Freq: Four times a day (QID) | RESPIRATORY_TRACT | Status: DC | PRN
  Administered 2015-08-27: 2.5 mg via RESPIRATORY_TRACT
  Filled 2015-08-26: qty 3

## 2015-08-26 MED ORDER — POTASSIUM CHLORIDE CRYS ER 20 MEQ PO TBCR
40.0000 meq | EXTENDED_RELEASE_TABLET | Freq: Once | ORAL | Status: AC
  Administered 2015-08-26: 40 meq via ORAL
  Filled 2015-08-26: qty 2

## 2015-08-26 MED ORDER — WHITE PETROLATUM GEL
Status: AC
  Administered 2015-08-26: 19:00:00
  Filled 2015-08-26: qty 1

## 2015-08-26 MED ORDER — MONTELUKAST SODIUM 10 MG PO TABS
10.0000 mg | ORAL_TABLET | Freq: Every day | ORAL | Status: DC
  Administered 2015-08-26 – 2015-08-28 (×3): 10 mg via ORAL
  Filled 2015-08-26 (×3): qty 1

## 2015-08-26 MED ORDER — ACETAMINOPHEN 325 MG PO TABS
650.0000 mg | ORAL_TABLET | Freq: Four times a day (QID) | ORAL | Status: DC | PRN
  Administered 2015-08-27: 650 mg via ORAL
  Filled 2015-08-26: qty 2

## 2015-08-26 MED ORDER — SODIUM CHLORIDE 0.9 % IV SOLN
INTRAVENOUS | Status: AC
  Administered 2015-08-26 (×2): via INTRAVENOUS

## 2015-08-26 MED ORDER — SODIUM CHLORIDE 0.9% FLUSH
3.0000 mL | Freq: Two times a day (BID) | INTRAVENOUS | Status: DC
  Administered 2015-08-26 – 2015-08-28 (×2): 3 mL via INTRAVENOUS

## 2015-08-26 MED ORDER — PRAVASTATIN SODIUM 20 MG PO TABS
20.0000 mg | ORAL_TABLET | Freq: Every evening | ORAL | Status: DC
  Administered 2015-08-26 – 2015-08-27 (×2): 20 mg via ORAL
  Filled 2015-08-26 (×2): qty 1

## 2015-08-26 MED ORDER — DONEPEZIL HCL 10 MG PO TABS
10.0000 mg | ORAL_TABLET | Freq: Every day | ORAL | Status: DC
  Administered 2015-08-26 – 2015-08-28 (×3): 10 mg via ORAL
  Filled 2015-08-26 (×3): qty 1

## 2015-08-26 MED ORDER — ASPIRIN EC 81 MG PO TBEC
81.0000 mg | DELAYED_RELEASE_TABLET | Freq: Every day | ORAL | Status: DC
  Administered 2015-08-26 – 2015-08-28 (×3): 81 mg via ORAL
  Filled 2015-08-26 (×3): qty 1

## 2015-08-26 MED ORDER — WHITE PETROLATUM GEL
Status: AC
  Administered 2015-08-26: 21:00:00
  Filled 2015-08-26: qty 1

## 2015-08-26 NOTE — ED Provider Notes (Signed)
CSN: [id number]     Arrival date & time 08/26/15  [id number] History   First MD Initiated Contact with Patient 08/26/15 1104     Chief Complaint  Patient presents with  . Loss of Consciousness     (Consider location/radiation/quality/duration/timing/severity/associated sxs/prior Treatment) HPI Son got the patient up for breakfast this morning as per usual. She ate her breakfast and was put back to bed for a nap. He reports she called him in to help her go to the bathroom. He had placed her in a rolling chair because she has become very weak over the past several days. He reports one rolling her into the bathroom her head went off to the side and her left hand tightened up. He reports her eyes rolled back he became unresponsive. He reports that lasted for almost 15 minutes. He reports she was breathing but it was irregular sounding. EMS arrived and put the patient on oxygen and her taking her vital signs. Her son reports that she gradually began awakening at that time. The patient is chronically on oxygen for unknown reason. Family member reports that she's had extensive workup and they are considering autoimmune problem as the reason for her chronic hypoxemia. Patient had gotten worse over the weekend and was seen at [facility]. They describe a CT scan having been done. It is unclear whether or not she had a head and the chest done. Family members report that she did not have pneumonia but did have influenza. Past Medical History  Diagnosis Date  . HTN (hypertension)   . Angina at rest [facility])     history of  . Asthma   . Hyperlipidemia   . Stroke ([place])   . Sleep apnea    Past Surgical History  Procedure Laterality Date  . Kidney stone surgery      right side  . Cholecystectomy    . Right heart catheterization N/A 03/16/2013    Procedure: RIGHT HEART CATH;  Surgeon: Jolaine Artist, MD;  Location: [facility] CATH LAB;  Service: Cardiovascular;  Laterality: N/A;   Family History  Problem  Relation Age of Onset  . Hypertension Father   . Bone cancer Father     multiple type of cancers  . Heart disease Mother    Social History  Substance Use Topics  . Smoking status: Never Smoker   . Smokeless tobacco: None  . Alcohol Use: No   OB History    No data available     Review of Systems 10 Systems reviewed and are negative for acute change except as noted in the HPI.   Allergies  Review of patient's allergies indicates no known allergies.  Home Medications   Prior to Admission medications   Medication Sig Start Date End Date Taking? Authorizing Provider  amLODipine (NORVASC) 2.5 MG tablet Take 2.5 mg by mouth daily.   Yes Historical Provider, MD  aspirin EC 81 MG tablet Take 81 mg by mouth as needed.    Yes Historical Provider, MD  azaTHIOprine (IMURAN) 50 MG tablet Take 50 mg by mouth daily.   Yes Historical Provider, MD  carvedilol (COREG) 6.25 MG tablet Take 6.25 mg by mouth 2 (two) times daily with a meal.   Yes Historical Provider, MD  donepezil (ARICEPT) 10 MG tablet Take 10 mg by mouth at bedtime.   Yes Historical Provider, MD  FERROUS SULFATE PO Take 1 tablet by mouth daily.   Yes Historical Provider, MD  albuterol (PROVENTIL HFA;VENTOLIN HFA) 108 (90  BASE) MCG/ACT inhaler Inhale into the lungs every 6 (six) hours as needed for wheezing.    Historical Provider, MD  albuterol (PROVENTIL) (2.5 MG/3ML) 0.083% nebulizer solution Take 2.5 mg by nebulization every 6 (six) hours as needed for wheezing or shortness of breath.    Historical Provider, MD  ergocalciferol (VITAMIN D2) 50000 UNITS capsule Take 50,000 Units by mouth once a week. Monday    Historical Provider, MD  Fluticasone-Salmeterol (ADVAIR) 100-50 MCG/DOSE AEPB Inhale 1 puff into the lungs 2 (two) times daily as needed.     Historical Provider, MD  furosemide (LASIX) 20 MG tablet Take 20 mg by mouth every Monday, Wednesday, and Friday. 01/15/13   Historical Provider, MD  gabapentin (NEURONTIN) 300 MG  capsule Take 300 mg by mouth 2 (two) times daily.    Historical Provider, MD  hydrALAZINE (APRESOLINE) 50 MG tablet Take 50 mg by mouth 2 (two) times daily.    Historical Provider, MD  losartan (COZAAR) 100 MG tablet Take 100 mg by mouth daily.    Historical Provider, MD  meclizine (ANTIVERT) 25 MG tablet Take 25 mg by mouth 3 (three) times daily as needed for dizziness.     Historical Provider, MD  montelukast (SINGULAIR) 10 MG tablet Take 10 mg by mouth at bedtime.    Historical Provider, MD  Multiple Vitamins-Minerals (MULTIVITAMIN WITH MINERALS) tablet Take 1 tablet by mouth daily.    Historical Provider, MD  pravastatin (PRAVACHOL) 20 MG tablet Take 20 mg by mouth every evening.     Historical Provider, MD  sulfamethoxazole-trimethoprim (BACTRIM DS) 800-160 MG per tablet Take 1 tablet by mouth 3 (three) times a week. 01/01/14   Kathee Delton, MD   BP 135/87 mmHg  Pulse 85  Temp(Src) 100.5 F (38.1 C) (Oral)  Resp 18  SpO2 97% Physical Exam  Constitutional:  Patient is moderately obese and deconditioned. She is awake and alert. She is nontoxic. No acute rest or distress at rest.  HENT:  Head: Normocephalic and atraumatic.  Mouth/Throat: Oropharynx is clear and moist.  Eyes: EOM are normal. Pupils are equal, round, and reactive to light.  Neck: Neck supple.  Cardiovascular: Normal rate, regular rhythm, normal heart sounds and intact distal pulses.   Pulmonary/Chest:  Rales expiratory wheeze left chest. Right grossly clear. No respiratory distress at rest.  Abdominal: Soft. Bowel sounds are normal. She exhibits no distension and no mass. There is no tenderness. There is no rebound and no guarding.  Musculoskeletal: Normal range of motion. She exhibits no edema or tenderness.  Neurological: She is alert. Coordination normal.  Skin: There is pallor.  Psychiatric: She has a normal mood and affect.    ED Course  Procedures (including critical care time) Labs Review Labs Reviewed   BASIC METABOLIC PANEL - Abnormal; Notable for the following:    Potassium 3.1 (*)    Chloride 98 (*)    Glucose, Bld 138 (*)    Calcium 8.7 (*)    GFR calc non Af Amer 56 (*)    All other components within normal limits  CBC - Abnormal; Notable for the following:    RBC 3.29 (*)    Hemoglobin 10.3 (*)    HCT 31.6 (*)    Platelets 149 (*)    All other components within normal limits  URINALYSIS, ROUTINE W REFLEX MICROSCOPIC (NOT AT [facility]) - Abnormal; Notable for the following:    Color, Urine AMBER (*)    APPearance CLOUDY (*)    Bilirubin  Urine SMALL (*)    Protein, ur 100 (*)    All other components within normal limits  URINE MICROSCOPIC-ADD ON - Abnormal; Notable for the following:    Squamous Epithelial / LPF 0-5 (*)    Bacteria, UA FEW (*)    Casts HYALINE CASTS (*)    All other components within normal limits  CBG MONITORING, ED  I-STAT TROPOININ, ED  I-STAT CG4 LACTIC ACID, ED    Imaging Review Dg Chest 2 View  08/26/2015  CLINICAL DATA:  Persistent productive cough EXAM: CHEST  2 VIEW COMPARISON:  August 24, 2015 chest radiograph and chest CT FINDINGS: There is no appreciable edema or consolidation. Heart is upper normal in size with pulmonary vascularity within normal limits. There is atherosclerotic calcification in the aorta. There is degenerative change in the thoracic spine. Bones are osteoporotic. IMPRESSION: No edema or consolidation. Electronically Signed   By: Lowella Grip III M.D.   On: 08/26/2015 10:47   Ct Head Wo Contrast  08/26/2015  CLINICAL DATA:  Syncopal event EXAM: CT HEAD WITHOUT CONTRAST TECHNIQUE: Contiguous axial images were obtained from the base of the skull through the vertex without intravenous contrast. COMPARISON:  None. FINDINGS: Bony calvarium is intact. Diffuse mucosal thickening is noted within the paranasal sinuses. Small air-fluid level is noted in the left maxillary antrum consistent with sinusitis. Mild atrophic changes are noted.  Chronic white matter ischemic change is seen as well. No findings to suggest acute hemorrhage, acute infarction or space-occupying mass lesion are noted. Left thalamic lacunar infarct is noted. IMPRESSION: Chronic atrophic and ischemic changes. Changes consistent with acute on chronic sinusitis in the left maxillary antrum. Electronically Signed   By: Inez Catalina M.D.   On: 08/26/2015 11:08   I have personally reviewed and evaluated these images and lab results as part of my medical decision-making.   EKG Interpretation   Date/Time:  Tuesday August 26 2015 09:46:59 EDT Ventricular Rate:  80 PR Interval:  192 QRS Duration: 91 QT Interval:  409 QTC Calculation: 472 R Axis:   81 Text Interpretation:  Sinus rhythm Consider left atrial enlargement  Borderline right axis deviation Nonspecific T abnormalities, diffuse leads  no STEMI, borderline ST depression lateral c/w old Confirmed by Johnney Killian,  MD, Jeannie Done [phone]) on 08/26/2015 12:01:38 PM     Consult: Reviewed with family medicine for admission. MDM   Final diagnoses:  Influenza  Unresponsive episode  General weakness   Patient presents with influenza diagnosed over the weekend. She has had increasing weakness. The family members report at baseline she is able to get up and ambulate with some assistance but has required significant assistance and being placed in a rolling chair in her home to be moved about. Patient had a prolonged episode of unresponsiveness this morning. At this time, the patient will be admitted for ongoing observation and treatment. Etiology of unresponsive episode is uncertain at this time. Differential includes cardiac dysrhythmia, hypotension, hypoxia. Currently the patient is awake and alert. Vital signs are stable with low-grade fever. Chest x-ray does not show focal secondary pneumonia. Antibiotics are not empirically initiated.    Charlesetta Shanks, MD 08/26/15 [phone]

## 2015-08-26 NOTE — ED Notes (Signed)
Family at bedside. 

## 2015-08-26 NOTE — ED Notes (Signed)
Patient transported to X-ray 

## 2015-08-26 NOTE — H&P (Signed)
[facility] Admission History and Physical Service Pager: [phone]  Patient name: Ann Beasley Medical record number: [id number] Date of birth: 12/09/1931 Age: 82 y.o. Gender: female  Primary Care Provider: Ernestene Kiel, MD Consultants: None. Code Status: Limited. Okay with CPR, not okay with intubation.   Chief Complaint: Loss of consciousness  Assessment and Plan: Ann Beasley is a 82 y.o. female presenting with acute syncope lasting approximately 15 minutes in setting of recent diagnosis of influenza at OSH. 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  #Syncope: DDx includes cardiac arrhythmia, dehydration, hypotension secondary to medications, hypoxia, TIA, seizure, infection. Acute onset without prodrome or associated symptoms is concerning for possible cardiac etiology. EKG shows no current arrhythmias, normal QTc on admission. Pt denies CP or palpitations. Patient also at high risk for dehydration, hypotension, and hypoxia as causes of acute syncope given recent decreased PO intake in setting of influenza, several medications that can precipitate hypotension, and chronic hypoxia with poor use of control medications over recent weeks. Seizure less likely given lack of postictal symptoms, though still possible given abnormal presentation. TIA/stroke less likely given no focal neurologic deficits, no acute change on head CT. New bacterial infection possible but less likely given clean urine, CXR without consolidation or edema, and lack of clinical signs or symptoms.  - Telemetry to monitor for arrhythmias.  - Repeat EKG in AM - Hold home Lasix, meclizine, neurontin - Begin maintenance fluids with normal saline 150mg /hr x12hrs - Restart home oxygen - obtain orthostatic vitals  - PT consult for generalized weakness - fall precautions  - Monitor vitals.  Watch foot cellulitis for improvement.  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 Pulmonology also felt this was worsened by obesity and deconditioning.  - Continue home O2 regimen - Continue azathioprine - Continue home Advair, singulair  #Anemia: appears to be at baseline, on ferrous at home. No iron panel in our system - iron panel in the AM  #Hypertension   - Patient normotensive at this time - Continue CoReg - Hold losartan, amlodipine, hydralazine for now - Monitor  #Hyperlipidemia - Continue home pravastatin  #Hx of TIA, history of angina - Start 81mg  ASA daily   #Sleep apnea - Continue home O2 - Cannot use CPAP with nasal cannula   #Dementia - Continue home donepezil - patient is at a high risk of delirium, continue to monitor.   FEN/GI: Maintenance fluids, heart healthy diet.  Prophylaxis: Lovenox  Disposition: Admit for monitoring, rehydration.   History of Present Illness:  Ann Beasley is a 82 y.o. female presenting with syncopal episode.   The patient was in her normal state of health this morning. She felt nauseated prior to breakfast, coughed, and gagged but this resolved.  Her son gave her breakfast and her medications this morning. The patient felt fine at that point and her family reports that she was improved from the day prior. She then took a nap and she recalls waking up and asking her son to take her to the bathroom. At that time she was asymptomatic, denies nausea, vomiting, dizziness, CP, or SOB. Her son removed her oxygen, lifted her into a rolling chair, and began rolling her to the bathroom. Seconds after placing her in the chair, he noticed her left hand curl up, she started jerking  in her left upper extremity and head, her eyes rolled in the back of her head, and she was unresponsive. Her breathing became sporadic but continued throughout her  period of unconsciousness which lasted about 15 minutes. When EMTs arrived she was still unconscious however after they placed on O2, she started to walk up and 10 minutes later she was talking. She felt normal after waking up. She denies feeling confused or groggy. Denies tongue biting or urinary incontinence at time of loss of consciousness. Son reports possible loss of bowel at that time.   Of note, the patient began feeling weak on Sunday morning and went to the ED at [facility]. They did a full workup and found no problems with the exception of a positive flu swab. She had poor PO intake for two days until this morning when she began to feel better. She has not had vomiting, but has had a few days of loose stools. Family reports that this has been a chronic problem that has not been evaluated.  She is normally able to ambulate with a cane until Sunday when she was unable to walk on her own, prompting her trip to [facility]. Since that time she has been moving around with the help of her son and/or a rolling chair. Her family reports that she has been dragging her right leg for the past several days which is a new problem for her. She does have an abrasion on her right ankle that her PCP is currently treating with antibiotics. They believe this could have been the cause of limping, but report that it is dramatically improved since initial presentation. Patient denies weakness or loss of sensation.   She is normally on 2L [place] at night and with exertion; starting Sunday she started wearing it 24hrs a day.  She had previously quit using her nebulizers for an unknown period of time because "she does not like medicine" but her children were unaware of this until this weekend. She was on 2L continuously for three years for lung disease (unknown etiology, thought to be autoimmune), but has improved over the past year with decreased need for O2.  Patient denies chest pain or palpitations. Denies new  onset dizziness. Does take meclizine intermittently for this but not in the last week. She also takes Ativan, Xanax, tramadol PRN but has not required this in the past week. She is actively taking Lasix three times a week for LE edema.   Review Of Systems: Per HPI with the following additions: diarrhea, occasional right leg pain Otherwise the remainder of the systems were negative.  Patient Active Problem List   Diagnosis Date Noted  . Influenza 08/26/2015  . History of asthma 05/03/2014  . Other generalized ischemic cerebrovascular disease 05/04/2013  . Dyspnea 01/12/2013  . Chronic respiratory failure ([place]) 01/12/2013  . Pulmonary infiltrates 01/12/2013    Past Medical History: Past Medical History  Diagnosis Date  . HTN (hypertension)   . Angina at rest [facility])     history of  . Asthma   . Hyperlipidemia   . Stroke ([place])   . Sleep apnea     Past Surgical History: Past Surgical History  Procedure Laterality Date  . Kidney stone surgery      right side  . Cholecystectomy    . Right heart catheterization N/A 03/16/2013    Procedure: RIGHT HEART CATH;  Surgeon: Jolaine Artist, MD;  Location: [facility] CATH LAB;  Service: Cardiovascular;  Laterality: N/A;  Social History: Social History  Substance Use Topics  . Smoking status: Never Smoker   . Smokeless tobacco: None  . Alcohol Use: No   Additional social history: Lives at home with husband. Son and daughter in law live nearby and son has been living with her for the past two days to care for her.   Family History: Family History  Problem Relation Age of Onset  . Hypertension Father   . Bone cancer Father     multiple type of cancers  . Heart disease Mother     Allergies and Medications: No Known Allergies No current facility-administered medications on file prior to encounter.   Current Outpatient Prescriptions on File Prior to Encounter  Medication Sig Dispense Refill  . amLODipine (NORVASC) 2.5 MG tablet  Take 2.5 mg by mouth daily.    Marland Kitchen aspirin EC 81 MG tablet Take 81 mg by mouth as needed.     Marland Kitchen azaTHIOprine (IMURAN) 50 MG tablet Take 50 mg by mouth daily.    . carvedilol (COREG) 6.25 MG tablet Take 6.25 mg by mouth 2 (two) times daily with a meal.    . donepezil (ARICEPT) 10 MG tablet Take 10 mg by mouth at bedtime.    Marland Kitchen FERROUS SULFATE PO Take 1 tablet by mouth daily.    Marland Kitchen albuterol (PROVENTIL HFA;VENTOLIN HFA) 108 (90 BASE) MCG/ACT inhaler Inhale into the lungs every 6 (six) hours as needed for wheezing.    Marland Kitchen albuterol (PROVENTIL) (2.5 MG/3ML) 0.083% nebulizer solution Take 2.5 mg by nebulization every 6 (six) hours as needed for wheezing or shortness of breath.    . ergocalciferol (VITAMIN D2) 50000 UNITS capsule Take 50,000 Units by mouth once a week. Monday    . Fluticasone-Salmeterol (ADVAIR) 100-50 MCG/DOSE AEPB Inhale 1 puff into the lungs 2 (two) times daily as needed.     . furosemide (LASIX) 20 MG tablet Take 20 mg by mouth every Monday, Wednesday, and Friday.    . gabapentin (NEURONTIN) 300 MG capsule Take 300 mg by mouth 2 (two) times daily.    . hydrALAZINE (APRESOLINE) 50 MG tablet Take 50 mg by mouth 2 (two) times daily.    Marland Kitchen losartan (COZAAR) 100 MG tablet Take 100 mg by mouth daily.    . meclizine (ANTIVERT) 25 MG tablet Take 25 mg by mouth 3 (three) times daily as needed for dizziness.     . montelukast (SINGULAIR) 10 MG tablet Take 10 mg by mouth at bedtime.    . Multiple Vitamins-Minerals (MULTIVITAMIN WITH MINERALS) tablet Take 1 tablet by mouth daily.    . pravastatin (PRAVACHOL) 20 MG tablet Take 20 mg by mouth every evening.     . sulfamethoxazole-trimethoprim (BACTRIM DS) 800-160 MG per tablet Take 1 tablet by mouth 3 (three) times a week. 12 tablet 6    Objective: BP 135/87 mmHg  Pulse 85  Temp(Src) 100.5 F (38.1 C) (Oral)  Resp 18  SpO2 97% Exam: General: Elderly female lying back in bed eating graham crackers and talking to her family. No apparent  distress. Eyes: PERRL, small pupil size ~85mm bilaterally, EOM intact ENTM: Mucous membranes pink and dry.   Neck: Nontender without LAD Cardiovascular: RRR, no m/r/g, no peripheral edema Respiratory: CTAB with decreased breath sounds and poor effort. Abdomen: NTND, no bruises.  MSK: Did not walk with patient today. Skin: Warm and well perfused. Bruise on left knee, mild bruising and healing abrasion (1cmx1cm) on anterior aspect of right ankle Neuro: CN II-XII intact.  UE 5/5 strength bilaterally. LE strength 4/5 bilaterally. Bicep and patellar reflexes 2+ bilaterally. Resting hand tremor left hand greater than right. Normal FTN bilaterally.  Psych: Normal mood and affect. Recalled 1/3 words after 8 minutes and obtained remaining two with category prompting.   Labs and Imaging: CBC BMET   Recent Labs Lab 08/26/15 1021  WBC 5.0  HGB 10.3*  HCT 31.6*  PLT 149*    Recent Labs Lab 08/26/15 1021  NA 136  K 3.1*  CL 98*  CO2 29  BUN 19  CREATININE 0.93  GLUCOSE 138*  CALCIUM 8.7*     EKG: NSR, unchanged from previous  itrop 0.02 Lactic acid- 1.62  U/A: neg LE or nitrite, hyaline casts   Imaging:  Dg Chest 2 View  08/26/2015  CLINICAL DATA:  Persistent productive cough EXAM: CHEST  2 VIEW COMPARISON:  August 24, 2015 chest radiograph and chest CT FINDINGS: There is no appreciable edema or consolidation. Heart is upper normal in size with pulmonary vascularity within normal limits. There is atherosclerotic calcification in the aorta. There is degenerative change in the thoracic spine. Bones are osteoporotic. IMPRESSION: No edema or consolidation. Electronically Signed   By: Lowella Grip III M.D.   On: 08/26/2015 10:47   Ct Head Wo Contrast  08/26/2015  CLINICAL DATA:  Syncopal event EXAM: CT HEAD WITHOUT CONTRAST TECHNIQUE: Contiguous axial images were obtained from the base of the skull through the vertex without intravenous contrast. COMPARISON:  None. FINDINGS: Bony  calvarium is intact. Diffuse mucosal thickening is noted within the paranasal sinuses. Small air-fluid level is noted in the left maxillary antrum consistent with sinusitis. Mild atrophic changes are noted. Chronic white matter ischemic change is seen as well. No findings to suggest acute hemorrhage, acute infarction or space-occupying mass lesion are noted. Left thalamic lacunar infarct is noted. IMPRESSION: Chronic atrophic and ischemic changes. Changes consistent with acute on chronic sinusitis in the left maxillary antrum. Electronically Signed   By: Inez Catalina M.D.   On: 08/26/2015 11:08    Quin Hoop, Med Student 08/26/2015, 1:04 PM MS4, [place] Intern pager: [phone], text pages welcome  RESIDENT ADDENDUM  I have separately seen and examined the patient. I have discussed the findings and exam with the medical student and agree with the above note, which I have edited appropriately. I helped develop the management plan that is described in the student's note, and I agree with the content.   Patient presenting with an episode of sudden onset syncope with LOC while sitting in a chair on her way to the bathroom. No prodromal symptoms. No confusion afterwards. Patient diagnosed with flu on Sunday. She's been having significant generalized weakness, and was noted to be dragging her R leg. She is now unable to ambulate. She was on many sedating medications such Xanax, Ativan, meclizine. She is still taking Lasix three times per week. Family states she's had pretty good PO intake over the last few days, however it was poor a few days prior.  Additionally I have outlined my exam and assessment/plan below:    PE:  Blood pressure 121/101, pulse 87, temperature 100.5 F (38.1 C), temperature source Oral, resp. rate 15, height 5\' 5"  (1.651 m), weight 164 lb 9.6 oz (74.662 kg), SpO2 97 %. General: Lying in bed in NAD. Chronically ill appearing, pale.  Eyes: Conjunctivae  non-injected.  ENTM: Moist mucous membranes. Oropharynx clear. No nasal discharge.  Neck: Supple, no LAD Cardiovascular: RRR. No  murmurs, rubs, or gallops noted. No pitting edema noted. Respiratory: No increased WOB. Decreased air movement. Occasional wheeze noted. On 3L [place] satting 98%.  Abdomen: +BS, soft, non-distended, non-tender.  MSK:  No gross deformities noted. Significant ecchymoses over the anterior L knee without tenderness or crepitus, full range of motion.   Skin: No rashes noted. Small well healing lesion over the anterior right ankle with granulation tissue and a small amount of surrounding erythema, not warm to touch, without drainage.  Neuro: A&O x4. Able to repeat 1/3 words after 5 minutes, she can recall 3/3 with prompting/hints. Speech clear. EOMI, pupils constricted, however reactive to light and symmetric.  Uvula and tongue midline. Facial movements symmetric. 5/5 strength in the upper extremities and lower extremities bilaterally. Sensation intact bilaterally. Normal DTRs. Downgoing [place].  Psych:  Appropriate mood and affect.     A/P:  Ann Beasley is a 81 y.o. female presenting with acute syncope lasting approximately 15 minutes in setting of recent diagnosis of influenza at OSH. 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  #Syncope: DDx includes cardiac arrhythmia, dehydration, hypotension secondary to medications, hypoxia, TIA, seizure, infection. Acute onset without prodrome or associated symptoms is concerning for possible cardiac etiology. EKG shows no current arrhythmias, normal QTc on admission. Pt denies CP or palpitations. Patient also at high risk for dehydration, hypotension, and hypoxia as causes of acute syncope given recent decreased PO intake in setting of influenza, several medications that can precipitate hypotension, and chronic hypoxia with poor use of  control medications over recent weeks. Seizure less likely given lack of postictal symptoms, though still possible given abnormal presentation. TIA/stroke less likely given no focal neurologic deficits, no acute change on head CT. New bacterial infection possible but less likely given clean urine, CXR without consolidation or edema, and lack of clinical signs or symptoms.  - Telemetry to monitor for arrhythmias.  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 Pulmonology also felt this was worsened by obesity and deconditioning.  - Continue home O2 regimen - Continue azathioprine - Continue home Dulera (for home Advair), singulair  #Anemia: appears to be at baseline, on ferrous sulfate at home. No iron panel in our system - iron panel in the AM  #Hypertension   - Patient normotensive at this time - Continue Coreg - Hold losartan, amlodipine, hydralazine for now  #Sleep apnea - Continue home O2 - Cannot use CPAP with nasal cannula   #Dementia - Continue home donepezil - patient is at a high risk of delirium, continue to monitor.    Archie Patten, MD PGY-2,  [facility] 08/26/2015  4:18 PM

## 2015-08-26 NOTE — ED Notes (Signed)
REMS from home, reported syncopal event with ?seizure activity, no trauma, CBG 160, VSS, A/O X3 20g LAC

## 2015-08-27 DIAGNOSIS — J111 Influenza due to unidentified influenza virus with other respiratory manifestations: Secondary | ICD-10-CM | POA: Diagnosis not present

## 2015-08-27 DIAGNOSIS — I10 Essential (primary) hypertension: Secondary | ICD-10-CM | POA: Diagnosis not present

## 2015-08-27 DIAGNOSIS — E876 Hypokalemia: Secondary | ICD-10-CM | POA: Diagnosis not present

## 2015-08-27 DIAGNOSIS — R531 Weakness: Secondary | ICD-10-CM | POA: Diagnosis not present

## 2015-08-27 LAB — COMPREHENSIVE METABOLIC PANEL
ALK PHOS: 61 U/L (ref 38–126)
ALT: 30 U/L (ref 14–54)
ANION GAP: 9 (ref 5–15)
AST: 82 U/L — ABNORMAL HIGH (ref 15–41)
Albumin: 2.6 g/dL — ABNORMAL LOW (ref 3.5–5.0)
BILIRUBIN TOTAL: 0.5 mg/dL (ref 0.3–1.2)
BUN: 12 mg/dL (ref 6–20)
CALCIUM: 8.3 mg/dL — AB (ref 8.9–10.3)
CO2: 26 mmol/L (ref 22–32)
Chloride: 105 mmol/L (ref 101–111)
Creatinine, Ser: 0.65 mg/dL (ref 0.44–1.00)
GFR calc non Af Amer: >60 mL/min (ref >60)
Glucose, Bld: 104 mg/dL — ABNORMAL HIGH (ref 65–99)
Potassium: 3.3 mmol/L — ABNORMAL LOW (ref 3.5–5.1)
Sodium: 140 mmol/L (ref 135–145)
TOTAL PROTEIN: 6.8 g/dL (ref 6.5–8.1)

## 2015-08-27 LAB — CBC
HCT: 30.5 % — ABNORMAL LOW (ref 36.0–46.0)
Hemoglobin: 10.2 g/dL — ABNORMAL LOW (ref 12.0–15.0)
MCH: 32.5 pg (ref 26.0–34.0)
MCHC: 33.4 g/dL (ref 30.0–36.0)
MCV: 97.1 fL (ref 78.0–100.0)
Platelets: 125 10*3/uL — ABNORMAL LOW (ref 150–400)
RBC: 3.14 MIL/uL — AB (ref 3.87–5.11)
RDW: 14.4 % (ref 11.5–15.5)
WBC: 3.7 10*3/uL — AB (ref 4.0–10.5)

## 2015-08-27 LAB — IRON AND TIBC
IRON: 59 ug/dL (ref 28–170)
SATURATION RATIOS: 18 % (ref 10.4–31.8)
TIBC: 328 ug/dL (ref 250–450)
UIBC: 269 ug/dL

## 2015-08-27 LAB — FERRITIN: FERRITIN: 79 ng/mL (ref 11–307)

## 2015-08-27 MED ORDER — IPRATROPIUM-ALBUTEROL 0.5-2.5 (3) MG/3ML IN SOLN
3.0000 mL | RESPIRATORY_TRACT | Status: DC
  Administered 2015-08-27: 3 mL via RESPIRATORY_TRACT
  Filled 2015-08-27: qty 3

## 2015-08-27 MED ORDER — ALBUTEROL SULFATE (2.5 MG/3ML) 0.083% IN NEBU
2.5000 mg | INHALATION_SOLUTION | Freq: Three times a day (TID) | RESPIRATORY_TRACT | Status: DC
  Administered 2015-08-27: 2.5 mg via RESPIRATORY_TRACT
  Filled 2015-08-27: qty 3

## 2015-08-27 MED ORDER — ALBUTEROL SULFATE (2.5 MG/3ML) 0.083% IN NEBU: 2.5000 mg | INHALATION_SOLUTION | Freq: Four times a day (QID) | RESPIRATORY_TRACT | Status: DC | PRN

## 2015-08-27 MED ORDER — IPRATROPIUM-ALBUTEROL 0.5-2.5 (3) MG/3ML IN SOLN
3.0000 mL | Freq: Three times a day (TID) | RESPIRATORY_TRACT | Status: DC
  Administered 2015-08-27 – 2015-08-28 (×2): 3 mL via RESPIRATORY_TRACT
  Filled 2015-08-27 (×2): qty 3

## 2015-08-27 MED ORDER — LOSARTAN POTASSIUM 50 MG PO TABS
100.0000 mg | ORAL_TABLET | Freq: Every day | ORAL | Status: DC
  Administered 2015-08-27 – 2015-08-28 (×2): 100 mg via ORAL
  Filled 2015-08-27 (×2): qty 2

## 2015-08-27 MED ORDER — GUAIFENESIN ER 600 MG PO TB12
600.0000 mg | ORAL_TABLET | Freq: Two times a day (BID) | ORAL | Status: DC
  Administered 2015-08-27 – 2015-08-28 (×5): 600 mg via ORAL
  Filled 2015-08-27 (×5): qty 1

## 2015-08-27 MED ORDER — POTASSIUM CHLORIDE CRYS ER 20 MEQ PO TBCR
40.0000 meq | EXTENDED_RELEASE_TABLET | Freq: Once | ORAL | Status: AC
  Administered 2015-08-27: 40 meq via ORAL
  Filled 2015-08-27: qty 2

## 2015-08-27 MED ORDER — SODIUM CHLORIDE 0.9 % IV SOLN
INTRAVENOUS | Status: DC
  Administered 2015-08-27 (×2): 1000 mL via INTRAVENOUS

## 2015-08-27 NOTE — Care Management Note (Signed)
Case Management Note  Patient Details  Name: Ann Beasley MRN: [id number] Date of Birth: June 23, 1931  Subjective/Objective:   82 y.o. F admitted 08/26/2015 with Influenza. PT evaluation recommending HHPT at discharge. Pt unable to tell me if she has a RW at home. Made [name], liason with [facility] aware of orders. Attempted to contact spouse  At home number in order to assess if RW needed. Unable. Will try again in AM.                Action/Plan: Anticipate discharge home when ready. No further CM needs but will be available should additional discharge needs arise.   Expected Discharge Date:                  Expected Discharge Plan:     In-House Referral:     Discharge planning Services  CM Consult  Post Acute Care Choice:    Choice offered to:  Patient, Spouse  DME Arranged:  Walker rolling DME Agency:  [facility]:  PT [place]:  [place]  Status of Service:     Medicare Important Message Given:    Date Medicare IM Given:    Medicare IM give by:    Date Additional Medicare IM Given:    Additional Medicare Important Message give by:     If discussed at [place] of Stay Meetings, dates discussed:    Additional Comments:  Delrae Sawyers, RN 08/27/2015, 4:19 PM

## 2015-08-27 NOTE — Evaluation (Signed)
Physical Therapy Evaluation Patient Details Name: NINON MAROLF MRN: [id number] DOB: 1932-03-25 Today's Date: 08/27/2015   History of Present Illness  Pt adm with syncopal episode and flu. PMH - HTN, asthma, CLL, dementia  Clinical Impression  Pt admitted with above diagnosis and presents to PT with functional limitations due to deficits listed below (See PT problem list). Pt needs skilled PT to maximize independence and safety to allow discharge to home with family and HHPT.        Follow Up Recommendations Home health PT    Equipment Recommendations  Other (comment) (May need RW if doesn't have one at home)    Recommendations for Other Services       Precautions / Restrictions Precautions Precautions: Fall Restrictions Weight Bearing Restrictions: No      Mobility  Bed Mobility               General bed mobility comments: Pt up on [facility]  Transfers Overall transfer level: Needs assistance Equipment used: Rolling walker (2 wheeled);None Transfers: Sit to/from [employer] to Stand: Min guard Stand pivot transfers: Min guard       General transfer comment: Assist for balance and safety  Ambulation/Gait Ambulation/Gait assistance: Min guard Ambulation Distance (Feet): 35 Feet Assistive device: Rolling walker (2 wheeled) Gait Pattern/deviations: Step-through pattern;Decreased step length - right;Decreased step length - left Gait velocity: decr Gait velocity interpretation: Below normal speed for age/gender General Gait Details: Assist for balance and cues for use of walker.  Stairs            Wheelchair Mobility    Modified Rankin (Stroke Patients Only)       Balance Overall balance assessment: Needs assistance Sitting-balance support: No upper extremity supported;Feet supported Sitting balance-Leahy Scale: Good     Standing balance support: No upper extremity supported Standing balance-Leahy Scale: Fair                               Pertinent Vitals/Pain Pain Assessment: No/denies pain    Home Living Family/patient expects to be discharged to:: Private residence Living Arrangements: Spouse/significant other Available Help at Discharge: Family;Available 24 hours/day Type of Home: House Home Access: Stairs to enter Entrance Stairs-Rails: Right Entrance Stairs-Number of Steps: 3-4 Home Layout: One level Home Equipment: Cane - single point Additional Comments: Pt states they might have a rolling walker at home    Prior Function Level of Independence: Independent with assistive device(s)         Comments: Uses cane     Hand Dominance        Extremity/Trunk Assessment   Upper Extremity Assessment: Generalized weakness           Lower Extremity Assessment: Generalized weakness         Communication   Communication: No difficulties  Cognition Arousal/Alertness: Awake/alert Behavior During Therapy: WFL for tasks assessed/performed Overall Cognitive Status: History of cognitive impairments - at baseline                      General Comments      Exercises        Assessment/Plan    PT Assessment Patient needs continued PT services  PT Diagnosis Difficulty walking;Generalized weakness   PT Problem List Decreased strength;Decreased activity tolerance;Decreased balance;Decreased mobility;Decreased knowledge of use of DME  PT Treatment Interventions DME instruction;Gait training;Functional mobility training;Therapeutic activities;Therapeutic exercise;Balance training;Patient/family education   PT Goals (  Current goals can be found in the Care Plan section) Acute Rehab PT Goals Patient Stated Goal: Return home PT Goal Formulation: With patient Time For Goal Achievement: 09/03/15 Potential to Achieve Goals: Good    Frequency Min 3X/week   Barriers to discharge        Co-evaluation               End of Session Equipment Utilized During  Treatment: Gait belt;Oxygen Activity Tolerance: Patient tolerated treatment well Patient left: in chair;with call bell/phone within reach;with chair alarm set Nurse Communication: Mobility status    Functional Assessment Tool Used: clinical judgement Functional Limitation: Mobility: Walking and moving around Mobility: Walking and Moving Around Current Status [id number]): At least 20 percent but less than 40 percent impaired, limited or restricted Mobility: Walking and Moving Around Goal Status [phone]): At least 1 percent but less than 20 percent impaired, limited or restricted    Time: 1140-1159 PT Time Calculation (min) (ACUTE ONLY): 19 min   Charges:   PT Evaluation $PT Eval Moderate Complexity: 1 Procedure     PT G Codes:   PT G-Codes **NOT FOR INPATIENT CLASS** Functional Assessment Tool Used: clinical judgement Functional Limitation: Mobility: Walking and moving around Mobility: Walking and Moving Around Current Status [id number]): At least 20 percent but less than 40 percent impaired, limited or restricted Mobility: Walking and Moving Around Goal Status [phone]): At least 1 percent but less than 20 percent impaired, limited or restricted    [facility] 08/27/2015, 12:29 PM [facility] PT [phone]

## 2015-08-27 NOTE — Progress Notes (Signed)
Family Medicine Teaching Service Daily Progress Note Intern Pager: [phone]  Patient name: Ann Beasley Medical record number: [id number] Date of birth: February 02, 1932 Age: 82 y.o. Gender: female  Primary Care Provider: PROCHNAU,CAROLINE, MD Consultants: None Code Status: Limited (CPR ok, DNI)  Pt Overview and Major Events to Date:  3/28: Admission for syncope. Cause unknown.   Assessment and Plan: Ann Beasley is a 82 y.o. female presenting with acute syncope lasting approximately 15 minutes in setting of recent diagnosis of influenza at OSH. 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  #Syncope: Cause still unknown. Most likely influenza + dehydration, but presentation also concerning for arrhythmia given acute onset without prodrome. Pt also prescribed several medications  Seizure less likely given lack of tongue biting, no postictal state. Also worth considering aortic stenosis given wide pulse pressure, though pt denying any recent chest pain (last echo 2014). - Continue telemetry to monitor for arrhythmia. None detected overnight. - Waiting for repeat EKG - Holding home Lasix, meclizine, neurontin, Xanax, ativan - Continue maintenance fluids with normal saline as patient improved with fluids and has poor PO intake. - continue home oxygen - Obtaining orthostatic vitals this AM - PT consult for generalized weakness - Fall precautions  - Monitor vitals - Consider echocardiogram to evaluate for aortic stenosis, heart function given that she has not had one since 2014 - Low threshold to consult cardiology  #Influenza: Positive at OSH.  - Continue tamiflu course  #Chronic lung disease with hypoxia thought to be secondary to vasculitis, being treated by rheumatology. Pulmonology also felt this was worsened by obesity and deconditioning. Patient was wheezy this AM. - Start scheduled  DuoNebs q4h to treat asthma symptoms - Continue home O2 regimen - Continue azathioprine - Continue home Advair, singulair, albuterol PRN  #Anemia: Appears to be at baseline, on ferrous at home. Iron panel suggests not iron deficiency. Possible anemia of chronic disease given vasculitis and chronic respiratory disease. Stable with baseline. - Continue to monitor - Peripheral smear given intermittent pancytopenia x2 years in our system  #Hypertension: Patient hypertensive overnight after holding several home meds. - Continue Coreg - Restart losartan given elevated pressures overnight - Continue to hold amlodipine, hydralazine for now - Monitor BPs  #Hypokalemia - replete with KDUR  #Poor PO intake: Albumin low. Unsure if this is secondary to infection. - nutrition consult  #Hyperlipidemia - Continue home pravastatin  #Hx of TIA, hx of angina - Start 81mg  ASA daily   #Sleep apnea - Continue home O2 - Cannot use CPAP with nasal cannula   #Dementia - Continue home donepezil - patient is at a high risk of delirium, continue to monitor.   FEN/GI: Maintenance fluids, heart healthy diet.  Prophylaxis: Lovenox  Disposition:   Subjective:  Patient had severe coughing overnight and received mucinex. Reports sleeping well but woke this morning disoriented and mildly dizzy. Family and patient report poor PO intake of both fluids and solids.   Objective: Temp:  [98.5 F (36.9 C)-100.5 F (38.1 C)] 98.6 F (37 C) (03/29 0512) Pulse Rate:  [77-96] 78 (03/29 0512) Resp:  [15-25] 20 (03/29 0512) BP: (100-151)/(46-101) 151/50 mmHg (03/29 0512) SpO2:  [91 %-100 %] 98 % (03/29 0849) Weight:  [74.662 kg (164 lb 9.6 oz)] 74.662 kg (164 lb 9.6 oz) (03/28 1503)   Physical Exam: General: Pt awake and oriented with mildly poor attention.  Cardiovascular: RRR. No m/r/g.  Normal S1, S2. Respiratory: Patient had diffuse wheezing across all lung fields. No crackles appreciated.  Abdomen:  NTND, no bruising or HSM Extremities: No peripheral edema.  Neuro: CN II-XII intact. Strength equal bilaterally upper and lower extremity.  Skin: Well healing abrasion right anterior ankle.   Laboratory:  Recent Labs Lab 08/26/15 1021 08/26/15 1546 08/27/15 0451  WBC 5.0 4.5 3.7*  HGB 10.3* 9.9* 10.2*  HCT 31.6* 30.1* 30.5*  PLT 149* 148* 125*    Recent Labs Lab 08/26/15 1021 08/26/15 1546 08/27/15 0451  NA 136  --  140  K 3.1*  --  3.3*  CL 98*  --  105  CO2 29  --  26  BUN 19  --  12  CREATININE 0.93 0.81 0.65  CALCIUM 8.7*  --  8.3*  PROT  --   --  6.8  BILITOT  --   --  0.5  ALKPHOS  --   --  61  ALT  --   --  30  AST  --   --  82*  GLUCOSE 138*  --  104*    Imaging/Diagnostic Tests: No new tests last 24 hours.  Quin Hoop, Med Student 08/27/2015, 9:02 AM MS4, [place] Intern pager: [phone], text pages welcome  RESIDENT ADDENDUM  I have separately seen and examined the patient. I have discussed the findings and exam with the medical student and agree with the above note, which I have edited appropriately. I helped develop the management plan that is described in the student's note, and I agree with the content.   Patient feels she's eating well considering she isn't exerting herself. Family concerned as she's had poor PO intake. No dizziness or syncope. No chest pain. Hasn't gotten up since admit.  Additionally I have outlined my exam and assessment/plan below:   PE:  Blood pressure 151/50, pulse 78, temperature 98.6 F (37 C), temperature source Oral, resp. rate 20, height 5\' 5"  (1.651 m), weight 164 lb 9.6 oz (74.662 kg), SpO2 98 %. General: Lying in bed in NAD. Chronically ill appearing, pale.  ENTM: Moist mucous membranes.  Cardiovascular: RRR. No murmurs, rubs, or gallops noted. No pitting edema noted. Respiratory: No increased WOB. Improved air movement however with increased wheezing diffusely. No crackles or  rhonchi. Abdomen: +BS, soft, non-distended, non-tender.  MSK: Ecchymoses over the L anterior knee without tenderness or crepitus, full range of motion.  Skin: No rashes noted. Small well healing lesion over the anterior right ankle with granulation tissue and a small amount of surrounding erythema, not warm to touch, without drainage.  Psych: Appropriate mood and affect  A/P:  #Syncope: DDx includes cardiac arrhythmia, dehydration, hypotension secondary to medications, hypoxia, TIA, seizure, infection. A - continue to monitor on telemetry.  - Repeat EKG in AM (still pending - Hold home Lasix, meclizine, neurontin - given poor PO intake, will add NS @ 125mg /hr - supplemental O2 - orthostatic vitals ordered but not obtained. - PT consult for generalized weakness - fall precautions   #Influenza  - continue Tamiflu for a 5d course.  #Chronic lung disease with hypoxia thought to be secondary to vasculitis, being treated by rheumatology. Pulmonology also felt this was worsened by obesity and deconditioning.  - Continue home O2 regimen - Continue azathioprine - Continue home Dulera (for home Advair), singulair - will schedule Duonebs  #Anemia: appears to be at baseline, on ferrous sulfate at home. Iron panel not consistent with chronic disease or iron  deficiency, however is still on ferrous sulfate - continue to monitor.  #Hypertension  - Patient normotensive at this time - Continue Coreg - Hold losartan, amlodipine, hydralazine for now  #Sleep apnea - Continue home O2 - Cannot use CPAP with nasal cannula    Archie Patten, MD PGY-2,  [facility] 08/27/2015  10:19 AM

## 2015-08-28 ENCOUNTER — Ambulatory Visit (HOSPITAL_BASED_OUTPATIENT_CLINIC_OR_DEPARTMENT_OTHER): Payer: Commercial Managed Care - HMO

## 2015-08-28 ENCOUNTER — Ambulatory Visit (HOSPITAL_COMMUNITY): Payer: Commercial Managed Care - HMO

## 2015-08-28 DIAGNOSIS — R55 Syncope and collapse: Secondary | ICD-10-CM

## 2015-08-28 DIAGNOSIS — I10 Essential (primary) hypertension: Secondary | ICD-10-CM | POA: Diagnosis not present

## 2015-08-28 DIAGNOSIS — J111 Influenza due to unidentified influenza virus with other respiratory manifestations: Secondary | ICD-10-CM | POA: Diagnosis not present

## 2015-08-28 DIAGNOSIS — R531 Weakness: Secondary | ICD-10-CM | POA: Diagnosis not present

## 2015-08-28 DIAGNOSIS — R4189 Other symptoms and signs involving cognitive functions and awareness: Secondary | ICD-10-CM | POA: Insufficient documentation

## 2015-08-28 DIAGNOSIS — E876 Hypokalemia: Secondary | ICD-10-CM | POA: Diagnosis not present

## 2015-08-28 LAB — COMPREHENSIVE METABOLIC PANEL
ALBUMIN: 2.4 g/dL — AB (ref 3.5–5.0)
ALK PHOS: 60 U/L (ref 38–126)
ALT: 26 U/L (ref 14–54)
AST: 64 U/L — AB (ref 15–41)
Anion gap: 5 (ref 5–15)
BILIRUBIN TOTAL: 0.5 mg/dL (ref 0.3–1.2)
BUN: 5 mg/dL — AB (ref 6–20)
CO2: 30 mmol/L (ref 22–32)
CREATININE: 0.65 mg/dL (ref 0.44–1.00)
Calcium: 8.3 mg/dL — ABNORMAL LOW (ref 8.9–10.3)
Chloride: 106 mmol/L (ref 101–111)
GFR calc Af Amer: >60 mL/min (ref >60)
GLUCOSE: 100 mg/dL — AB (ref 65–99)
POTASSIUM: 3.5 mmol/L (ref 3.5–5.1)
Sodium: 141 mmol/L (ref 135–145)
TOTAL PROTEIN: 6.5 g/dL (ref 6.5–8.1)

## 2015-08-28 LAB — ECHOCARDIOGRAM COMPLETE
Height: 65 in
Weight: 2633.6 oz

## 2015-08-28 LAB — CBC
HEMATOCRIT: 29.3 % — AB (ref 36.0–46.0)
HEMOGLOBIN: 9.8 g/dL — AB (ref 12.0–15.0)
MCH: 32.6 pg (ref 26.0–34.0)
MCHC: 33.4 g/dL (ref 30.0–36.0)
MCV: 97.3 fL (ref 78.0–100.0)
Platelets: 112 10*3/uL — ABNORMAL LOW (ref 150–400)
RBC: 3.01 MIL/uL — AB (ref 3.87–5.11)
RDW: 14.1 % (ref 11.5–15.5)
WBC: 3 10*3/uL — AB (ref 4.0–10.5)

## 2015-08-28 LAB — PATHOLOGIST SMEAR REVIEW

## 2015-08-28 MED ORDER — OSELTAMIVIR PHOSPHATE 30 MG PO CAPS: 30.0000 mg | ORAL_CAPSULE | Freq: Two times a day (BID) | ORAL | Status: DC

## 2015-08-28 MED ORDER — OSELTAMIVIR PHOSPHATE 75 MG PO CAPS: 75.0000 mg | ORAL_CAPSULE | Freq: Two times a day (BID) | ORAL | Status: DC

## 2015-08-28 MED ORDER — ENSURE ENLIVE PO LIQD
237.0000 mL | Freq: Two times a day (BID) | ORAL | Status: DC
  Administered 2015-08-28: 237 mL via ORAL

## 2015-08-28 MED ORDER — IPRATROPIUM-ALBUTEROL 0.5-2.5 (3) MG/3ML IN SOLN
3.0000 mL | Freq: Four times a day (QID) | RESPIRATORY_TRACT | Status: DC | PRN
  Administered 2015-08-28: 3 mL via RESPIRATORY_TRACT
  Filled 2015-08-28: qty 3

## 2015-08-28 NOTE — Progress Notes (Signed)
  Echocardiogram 2D Echocardiogram has been performed.  Darlina Sicilian M 08/28/2015, 3:46 PM

## 2015-08-28 NOTE — Care Management Obs Status (Signed)
[place] NOTIFICATION   Patient Details  Name: KERRILEE MOYNAHAN MRN: [id number] Date of Birth: 02-Nov-1931   Medicare Observation Status Notification Given:  Yes    CrutchfieldAntony Haste, RN 08/28/2015, 11:49 AM

## 2015-08-28 NOTE — Progress Notes (Signed)
Initial Nutrition Assessment  DOCUMENTATION CODES:   Not applicable  INTERVENTION:  -Ensure Enlive po BID, each supplement provides 350 kcal and 20 grams of protein -RD continue to monitor  NUTRITION DIAGNOSIS:   Inadequate oral intake related to poor appetite as evidenced by per patient/family report.  GOAL:   Patient will meet greater than or equal to 90% of their needs  MONITOR:   PO intake, Labs, I & O's, Skin, Weight trends  REASON FOR ASSESSMENT:   Consult Assessment of nutrition requirement/status  ASSESSMENT:   Ann Beasley is a 82 y.o. female presenting with acute syncope lasting approximately 15 minutes in setting of recent diagnosis of influenza at OSH. 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 She is a pleasant 82 yo woman who, according to her daughter, had a poor appetite prior to admission. Today, she ate all of her tray, daughter said this level of PO intake was new for her. Pt stated that she wasn't feeling well and thus wasn't hungry PTA. She also endorses general diminished appetite, likely related to aging.  Pt exhibits a 24#/12.7% insignificant wt loss in 10 months. She was unsure of weight loss because she "hadn't been weighed." Patient's most previous admission was 10/2014  She also is suffering from the flu & chronic resp failure, but exhibited no signs of respiratory distress during my visit.  Nutrition-Focused physical exam completed. Findings are no fat depletion, no muscle depletion, and moderate edema.   Edema was on her LLE where she fell.  Labs: Ca 8.3 Medications: Vit D 50000 IU Q Monday  Diet Order:  Diet Heart Room service appropriate?: Yes; Fluid consistency:: Thin  Skin:  Reviewed, no issues  Last BM:  3/30  Height:   Ht Readings from Last 1 Encounters:  08/26/15 5\' 5"  (1.651 m)     Weight:   Wt Readings from Last 1 Encounters:  08/26/15 164 lb 9.6 oz (74.662 kg)    Ideal Body Weight:  56.81 kg  BMI:  Body mass index is 27.39 kg/(m^2).  Estimated Nutritional Needs:   Kcal:  1530-1830  Protein:  75-90 grams  Fluid:  >/= 1.5L  EDUCATION NEEDS:   No education needs identified at this time  Satira Anis. Kathren Scearce, MS, RD LDN After Hours/Weekend Pager [phone]

## 2015-08-28 NOTE — Care Management Note (Signed)
Case Management Note  Patient Details  Name: NICA BENEDICT MRN: [id number] Date of Birth: 04-11-1932  Subjective/Objective:  82 y.o. F admitted with Syncope. CM spoke with daughter in law at bedside who reports pt has Roll[place] at home and does not need RW. Would like 3 n 1. Which I ordered from [place], [facility] DME representative. [facility] also to provide HHPT. Pt will have Oxygen which she had prior to admission. Family aware they will need to bring tank to hospital for transport home.    Action/Plan: Anticipate discharge home today. No further CM needs but will be available should additional discharge needs arise.   Expected Discharge Date:                  Expected Discharge Plan:  [place]  In-House Referral:     Discharge planning Services  CM Consult  Post Acute Care Choice:    Choice offered to:  Patient, Adult Children  DME Arranged:  3-N-1 (has Rollater at home) DME Agency:  [place]:  PT [place]:  [place]  Status of Service:  Completed, signed off  Medicare Important Message Given:  Yes Date Medicare IM Given:    Medicare IM give by:    Date Additional Medicare IM Given:    Additional Medicare Important Message give by:     If discussed at [place] of Stay Meetings, dates discussed:    Additional Comments:  Delrae Sawyers, RN 08/28/2015, 11:51 AM

## 2015-08-28 NOTE — Progress Notes (Signed)
Family Medicine Teaching Service Daily Progress Note Intern Pager: [phone]  Patient name: Ann Beasley Medical record number: [id number] Date of birth: Jun 20, 1931 Age: 82 y.o. Gender: female  Primary Care Provider: PROCHNAU,CAROLINE, MD Consultants: None Code Status: Limited (CPR, no intubation)  Pt Overview and Major Events to Date:  3/28: Admission for syncope. Cause unknown.  3/30: Improving, still no events  Assessment and Plan: Ann Beasley is a 82 y.o. female presenting with acute syncope lasting approximately 15 minutes in setting of recent diagnosis of influenza at OSH. 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  #Syncope: Cause still unknown. Most likely influenza + dehydration in setting of chronic hypoxia, but presentation also concerning for arrhythmia given acute onset without prodrome. Pt also prescribed several hypotensive medications. Seizure less likely given lack of tongue biting, no postictal state. Considering mechanical cardiac causes, including aortic stenosis (wide pulse pressure) and pericarditis (low voltage EKG in setting of chronic autoimmune disorder).  - Echocardiogram pending - Continue telemetry to monitor for arrhythmia. None detected x2 days. - EKG showed diffuse low voltage concerning for restrictive pericarditis, no signs concerning for arrhythmia  - Holding home Lasix, meclizine, neurontin, Xanax, ativan - Discontinue maintenance fluids to encourage PO intake - Continue home oxygen - Orthostatic vitals negative for orthostasis - Monitor vitals  #Influenza: Positive at OSH.  - Continue tamiflu course with renal dosing - Cough improving with Mucinex  #Chronic lung disease with hypoxia thought to be secondary to vasculitis, being treated by rheumatology. Pulmonology also felt this was worsened by obesity and deconditioning. Patient was wheezy  yesterday and started on scheduled DuoNebs. Wheezy this AM before and after nebs. - Continue DuoNebs q4h to treat asthma symptoms - Continue O2 as needed, increased to 3L overnight given drop to 86%. Pt may need increased O2 overnight given sleep apnea. - Continue azathioprine - Continue home Advair, singulair, albuterol PRN - May be cause of pancytopenia (smear pending)  #Anemia: Appears to be at baseline, on ferrous at home. Iron panel suggests not iron deficiency. Possible anemia of chronic disease given vasculitis and chronic respiratory disease. Stable with baseline. - Continue to monitor - Peripheral smear ordered and pending given pancytopenia  #Hypertension: Patient hypertensive overnight after holding several home meds. This may also be secondary to wrong BP cuff size due to patient preference.  - Hold CoReg given lack of indication - Restarted losartan yesterday, with improved pressures during day until single low BP measurement this AM. Continue to follow. - Continue to hold amlodipine, hydralazine for now  #Hypokalemia - Repleted yesterday - AM CMET shows K from 3.3->3.5 after 40 mEq repletion. Will give 10 mEq this AM.   #Poor PO intake: Albumin low. Unsure if this is secondary to infection. - Nutrition consult pending  #Hyperlipidemia - Continue home pravastatin  #Hx of TIA, hx of angina - 81mg  ASA daily   #Sleep apnea - Continue home O2 - Place CPAP at night given O2 dropping at night  #Dementia - Continue home donepezil - patient is at a high risk of delirium, continue to monitor.   FEN/GI: heart healthy diet Prophylaxis: Lovenox  Disposition: PT recommends HHPT. CM has acquired rolling wheeler for pt. Likely discharge home today pending results of echocardiogram.   Subjective:  Patient slept well overnight. Coughing decreased from yesterday now that patient is on mucinex. Reports regular urination without stools x2 days. States that her shortness  of breath  has improved with DuoNebs. Denies CP, dizziness. Patient feels comfortable to return home.   Objective: Temp:  [98.5 F (36.9 C)-98.6 F (37 C)] 98.5 F (36.9 C) (03/30 0532) Pulse Rate:  [66-79] 66 (03/30 0532) Resp:  [18-19] 18 (03/30 0532) BP: (85-152)/(40-69) 85/64 mmHg (03/30 0532) SpO2:  [86 %-99 %] 86 % (03/30 0532)  Physical Exam: General: Well appearing, sitting up in bed asking for her coffee. Cardiovascular: RRR, Loud S1. No m/r/g Respiratory: Lungs diffusely wheezy. Improved but wheezes still present after DuoNeb treatment. Mildly decreased air movement, improved from admission. Abdomen: Soft, +BS, NTND. Extremities: Warm, well perfused. No peripheral edema.  Laboratory:  Recent Labs Lab 08/26/15 1546 08/27/15 0451 08/28/15 0506  WBC 4.5 3.7* 3.0*  HGB 9.9* 10.2* 9.8*  HCT 30.1* 30.5* 29.3*  PLT 148* 125* PENDING    Recent Labs Lab 08/26/15 1021 08/26/15 1546 08/27/15 0451 08/28/15 0506  NA 136  --  140 141  K 3.1*  --  3.3* 3.5  CL 98*  --  105 106  CO2 29  --  26 30  BUN 19  --  12 5*  CREATININE 0.93 0.81 0.65 0.65  CALCIUM 8.7*  --  8.3* 8.3*  PROT  --   --  6.8 6.5  BILITOT  --   --  0.5 0.5  ALKPHOS  --   --  61 60  ALT  --   --  30 26  AST  --   --  82* 64*  GLUCOSE 138*  --  104* 100*    Imaging/Diagnostic Tests: None in last 24 hours.  Quin Hoop, Med Student 08/28/2015, 7:40 AM MS4, [place] Intern pager: [phone], text pages welcome  RESIDENT ADDENDUM  I have separately seen and examined the patient. I have discussed the findings and exam with the medical student and agree with the above note, which I have edited appropriately. I helped develop the management plan that is described in the student's note, and I agree with the content.   Patient and family feel she's eating better. Breathing more comfortably. Her daughter in law feels all this may have come on due to her self discontinuing her inhalers.    Additionally I have outlined my exam and assessment/plan below:   PE:  Blood pressure 153/72, pulse 94, temperature 98.5 F (36.9 C), temperature source Oral, resp. rate 18, height 5\' 5"  (1.651 m), weight 164 lb 9.6 oz (74.662 kg), SpO2 99 %.  General: sitting up in bed in NAD, more alert and interactive today. Eating breakfast.  ENTM: Moist mucous membranes.  Cardiovascular: RRR. No murmurs, rubs, or gallops noted. No pitting edema noted. Respiratory: No increased WOB. Improved air movement however with stable wheezing diffusely. No crackles or rhonchi. Abdomen: +BS, soft, non-distended, non-tender.  MSK: Ecchymoses over the L anterior knee without tenderness or crepitus, full range of motion.  Skin: No rashes noted. Small well healing abrasion with small amount of surrounding erythema, not warm to touch, without drainage.  Psych: Appropriate mood and affect  A/P:  #Syncope: DDx includes cardiac arrhythmia, dehydration, hypotension secondary to medications, hypoxia, TIA, seizure, infection.  No events on telemetry. Possibly due to hypoxia from non compliance with inhalers.  - Hold home Lasix, meclizine, neurontin - supplemental O2 - orthostatic vitals negative.  - PT consult: RW which patient already has, HH-PT - fall precautions   #Influenza  - continue Tamiflu for a 5d course.  #Chronic lung disease  with hypoxia thought to be secondary to vasculitis, being treated by rheumatology. Pulmonology also felt this was worsened by obesity and deconditioning.  - Continue home O2 regimen - Continue azathioprine - Continue home Dulera (for home Advair), singulair - will make Duonebs PRN  #Anemia: appears to be at baseline, on ferrous sulfate at home. Iron panel not consistent with chronic disease or iron deficiency, however is still on ferrous sulfate - continue to monitor.  #Hypertension  - Patient normotensive at this time - Hold Coreg amlodipine, hydralazine for now -  continue losartan  #Sleep apnea - Continue home O2 - Cannot use CPAP with nasal cannula   Archie Patten, MD [facility] Family Medicine Resident  08/28/2015, 9:38 AM

## 2015-08-28 NOTE — Consult Note (Signed)
   [facility] CM Inpatient Consult   08/28/2015  Ann Beasley 01-05-1932 [id number] Patient was screened and evaluated for [place] Management services for her [facility] Medicare benefit.  Patient will be followed by Home Health. Met with the patient and daughter regarding her benefits.  Daughter states she felt the she had no additional needs at this time. No care management needs identified at this time. A brochure and contact information was provided.   For questions, please contact: Natividad Brood, RN BSN [facility] Liaison  [phone] business mobile phone Toll free office [phone]

## 2015-08-28 NOTE — Progress Notes (Signed)
Nsg Discharge Note  Admit Date:  08/26/2015 Discharge date: 08/28/2015   JALYAH HESSLER to be D/C'd Home per MD order.  AVS completed.  Copy for chart, and copy for patient signed, and dated. Patient/caregiver able to verbalize understanding.  Discharge Medication:   Medication List    STOP taking these medications        amoxicillin-clavulanate 875-125 MG tablet  Commonly known as:  AUGMENTIN     carvedilol 6.25 MG tablet  Commonly known as:  COREG     furosemide 20 MG tablet  Commonly known as:  LASIX     hydrALAZINE 50 MG tablet  Commonly known as:  APRESOLINE      TAKE these medications        albuterol (2.5 MG/3ML) 0.083% nebulizer solution  Commonly known as:  PROVENTIL  Take 2.5 mg by nebulization every 6 (six) hours as needed for wheezing or shortness of breath.     albuterol 108 (90 Base) MCG/ACT inhaler  Commonly known as:  PROVENTIL HFA;VENTOLIN HFA  Inhale into the lungs every 6 (six) hours as needed for wheezing.     amLODipine 2.5 MG tablet  Commonly known as:  NORVASC  Take 2.5 mg by mouth daily.     aspirin EC 81 MG tablet  Take 81 mg by mouth as needed.     azaTHIOprine 50 MG tablet  Commonly known as:  IMURAN  Take 50 mg by mouth daily.     donepezil 10 MG tablet  Commonly known as:  ARICEPT  Take 10 mg by mouth at bedtime.     ergocalciferol 50000 units capsule  Commonly known as:  VITAMIN D2  Take 50,000 Units by mouth once a week. Reported on 08/27/2015     FERROUS SULFATE PO  Take 1 tablet by mouth daily.     Fluticasone-Salmeterol 100-50 MCG/DOSE Aepb  Commonly known as:  ADVAIR  Inhale 1 puff into the lungs 2 (two) times daily as needed. Reported on 08/27/2015     gabapentin 300 MG capsule  Commonly known as:  NEURONTIN  Take 300 mg by mouth 3 (three) times daily.     losartan 100 MG tablet  Commonly known as:  COZAAR  Take 100 mg by mouth daily.     meclizine 25 MG tablet  Commonly known as:  ANTIVERT  Take 25 mg by  mouth 3 (three) times daily as needed for dizziness.     montelukast 10 MG tablet  Commonly known as:  SINGULAIR  Take 10 mg by mouth at bedtime.     multivitamin with minerals tablet  Take 1 tablet by mouth daily.     oseltamivir 30 MG capsule  Commonly known as:  TAMIFLU  Take 1 capsule (30 mg total) by mouth 2 (two) times daily.     pravastatin 20 MG tablet  Commonly known as:  PRAVACHOL  Take 20 mg by mouth every evening.        Discharge Assessment: Filed Vitals:   08/28/15 0922 08/28/15 1353  BP: 153/72 136/73  Pulse: 94 85  Temp:  98.3 F (36.8 C)  Resp:  18   Skin clean, dry and intact without evidence of skin break down, no evidence of skin tears noted. IV catheter discontinued intact. Site without signs and symptoms of complications - no redness or edema noted at insertion site, patient denies c/o pain - only slight tenderness at site.  Dressing with slight pressure applied.  D/c Instructions-Education: Discharge instructions given  to patient/family with verbalized understanding. D/c education completed with patient/family including follow up instructions, medication list, d/c activities limitations if indicated, with other d/c instructions as indicated by MD - patient able to verbalize understanding, all questions fully answered. Patient instructed to return to ED, call 911, or call MD for any changes in condition.  Patient escorted via [place], and D/C home via private auto.  Salley Slaughter, RN 08/28/2015 5:48 PM

## 2015-08-28 NOTE — Discharge Instructions (Signed)
Ask your Primary Care Physician for a referral to a Cardiologist to follow your newly diagnosed "Aortic Stenosis".   Syncope Syncope is a medical term for fainting or passing out. This means you lose consciousness and drop to the ground. People are generally unconscious for less than 5 minutes. You may have some muscle twitches for up to 15 seconds before waking up and returning to normal. Syncope occurs more often in older adults, but it can happen to anyone. While most causes of syncope are not dangerous, syncope can be a sign of a serious medical problem. It is important to seek medical care.  CAUSES  Syncope is caused by a sudden drop in blood flow to the brain. The specific cause is often not determined. Factors that can bring on syncope include:  Taking medicines that lower blood pressure.  Sudden changes in posture, such as standing up quickly.  Taking more medicine than prescribed.  Standing in one place for too long.  Seizure disorders.  Dehydration and excessive exposure to heat.  Low blood sugar (hypoglycemia).  Straining to have a bowel movement.  Heart disease, irregular heartbeat, or other circulatory problems.  Fear, emotional distress, seeing blood, or severe pain. SYMPTOMS  Right before fainting, you may:  Feel dizzy or light-headed.  Feel nauseous.  See all white or all black in your field of vision.  Have cold, clammy skin. DIAGNOSIS  Your health care provider will ask about your symptoms, perform a physical exam, and perform an electrocardiogram (ECG) to record the electrical activity of your heart. Your health care provider may also perform other heart or blood tests to determine the cause of your syncope which may include:  Transthoracic echocardiogram (TTE). During echocardiography, sound waves are used to evaluate how blood flows through your heart.  Transesophageal echocardiogram (TEE).  Cardiac monitoring. This allows your health care provider to  monitor your heart rate and rhythm in real time.  Holter monitor. This is a portable device that records your heartbeat and can help diagnose heart arrhythmias. It allows your health care provider to track your heart activity for several days, if needed.  Stress tests by exercise or by giving medicine that makes the heart beat faster. TREATMENT  In most cases, no treatment is needed. Depending on the cause of your syncope, your health care provider may recommend changing or stopping some of your medicines. HOME CARE INSTRUCTIONS  Have someone stay with you until you feel stable.  Do not drive, use machinery, or play sports until your health care provider says it is okay.  Keep all follow-up appointments as directed by your health care provider.  Lie down right away if you start feeling like you might faint. Breathe deeply and steadily. Wait until all the symptoms have passed.  Drink enough fluids to keep your urine clear or pale yellow.  If you are taking blood pressure or heart medicine, get up slowly and take several minutes to sit and then stand. This can reduce dizziness. SEEK IMMEDIATE MEDICAL CARE IF:   You have a severe headache.  You have unusual pain in the chest, abdomen, or back.  You are bleeding from your mouth or rectum, or you have black or tarry stool.  You have an irregular or very fast heartbeat.  You have pain with breathing.  You have repeated fainting or seizure-like jerking during an episode.  You faint when sitting or lying down.  You have confusion.  You have trouble walking.  You have severe  weakness.  You have vision problems. If you fainted, call your local emergency services (911 in [place] Do not drive yourself to the hospital.    This information is not intended to replace advice given to you by your health care provider. Make sure you discuss any questions you have with your health care provider.   Document Released: 05/17/2005 Document  Revised: 10/01/2014 Document Reviewed: 07/16/2011 Elsevier Interactive Patient Education [employer].

## 2015-08-28 NOTE — Care Management Important Message (Signed)
Important Message  Patient Details  Name: Ann Beasley MRN: [id number] Date of Birth: 09-24-1931   Medicare Important Message Given:  Yes    Azriella Mattia, Antony Haste, RN 08/28/2015, 11:49 AM

## 2015-08-28 NOTE — Discharge Summary (Signed)
[facility] Discharge Summary  Patient name: Ann Beasley: [id number] Date of birth: 1932/03/02 Age: 82 y.o. Gender: female Date of Admission: 08/26/2015  Date of Discharge: 08/28/2015 Admitting Physician: Kinnie Feil, MD  Primary Care Provider: Ernestene Kiel, MD Consultants: None  Indication for Hospitalization: Syncope  Discharge Diagnoses/Problem List:  1. Acute syncope 2. Influenza 3. Moderate aortic stenosis, new diagnosis 4. Chronic respiratory disease with hypoxia 5. Hypertension 6. Hypokalemia, resolved 7. Normocytic anemia, pancytopenia 8. Sleep apnea  Disposition: Home with home health physical therapy, son to live with patient and assist  Discharge Condition: Stable on home oxygen (2L)  Exam: General: Elderly female lying back in bed eating breakfast in NAD.  Eyes: PERRL, EOM intact ENTM: Mucous mucous membranes.  Cardiovascular: RRR, no m/r/g, no peripheral edema Respiratory: CTAB with occasional wheeze but no increased WOB, on 2L [place] this AM.  Abdomen: +BS, NTND MSK: no abnormalities.  Skin: Warm and well perfused. Bruise on left knee, mild bruising and healing abrasion (1cmx1cm) on anterior aspect of right ankle Neuro: grossly intact this AM.   Brief Hospital Course:    [name] presented to the hospital after an acute syncopal episode lasting 15 minutes in setting of influenza diagnosis 2 days prior. The patient was being transferred from bed to restroom by family when she became acutely unresponsive with mild jerking of the left side of her body and full loss of tone. She denied any prodromal symptoms and lacked postictal state. This was her first episode of syncope. Given unknown etiology, she was admitted for evaluation of cause of syncope. Neurologic exam on admission was unremarkable besides mild weakness (4+/5 vs 5/5) of the lower extremities bilaterally. Family did note decreased PO intake.   Telemetry showed no arrhythmias and EKG showed no abnormalities that would place the patient at increased risk of arrhythmia or VT. Echocardiogram showed moderate aortic stenosis, new since last echo in 2014. Given aortic stenosis, active influenza, history of hypoxia, multiple antihypertensive and deliriogenic medications, and low albumin on admission there were many possible explanations for the patient's syncopal episode, which was likely multifactorial in etiology. She received rehydration with normal saline, oxygen to manage hypoxia, and adjustment of her medication list. The patient remained well appearing without dizziness or syncope throughout hospitalization. PT evaluated the patient and felt she was stable to go home with a rolling walker. On the day of discharge, her family felt she was significantly improved and near her baseline.    Given recent diagnosis of influenza at OSH with a single dose of tamiflu before admission, we elected to continue this prescription in hospital. She is being discharged with 2 remaining doses, dosed renally given patient's age.   The patient has a history of chronic respiratory disease with hypoxia and had been noncompliant with inhalers and oxygen at home prior to admission. She received 2L oxygen by [place] throughout hospitalization. She had a single O2 measurement of 86% which required an increase to 3L overnight, likely related to sleep apnea without CPAP. In addition to restarting her home medications, the patient also received DuoNebs q4h for significant wheezing. Wheezing improved with DuoNebs but remained significant throughout hospitalization. She was stable on home O2.    The patient has a history of hypertension with home prescriptions for losartan, amlodipine, hydralazine, Coreg, and lasix. These medications were stopped on admission given concern for hypotension contributing to syncope. Losartan was restarted on HOD#2 given elevated pressures (SBP ~150s) and  amlodipine  was re-started on the day of discharge once the diagnosis of aortic stenosis was made.  BP measurements were inconsistent throughout hospitalization, and may be due to the patient's intolerance of a proper sized blood pressure cuff. She is being discharged on losartan 100mg  and amlodipine only do to low BPs.    The patient's hemoglobin on admission was consistent with a normocytic anemia stable at baseline. An iron panel confirmed lack of iron deficiency anemia. This may be anemia of chronic disease given the patient's chronic respiratory failure of autoimmune etiology. Given consistent pancytopenia for the past several years per records we elected to obtain a peripheral smear which was pending upon discharge. On date of discharge pharmacy called and asked to STOP azathioprine short term given low WBC and low platelets.    Issues for Follow Up:  1. Moderate aortic stenosis: Patient with new diagnosis of AS in setting of syncope. She will need referral to cardiology for management and follow-up.  2. Hypertension: Patient will need outpatient adjustment of antihypertensives. She is being discharged on losartan 100 mg and amlodipine given concerns for hypotension contributing to syncope. Coreg was discontinued given contraindications in setting of respiratory disease and normal heart function. Hydralazine was also discontinued.  3. Chronic respiratory disease with hypoxia: Patient has been noncompliant with inhalers and oxygen at home. She required 2L oxygen by nasal cannula throughout hospitalization and recommend that she use O2 at bedtime and with activity, and follow up with her pulmonologist closely as she may require continuous O2.  4. Sleep apnea: Pt has a history of sleep apnea but has not been using CPAP at night given that her machine is incompatible with her nasal cannula. Given overnight O2 to 86% while on home oxygen, recommend pt follow-up regarding use of CPAP/ alternate machine with  O2 compatibility. Treatment of sleep apnea has been shown to improve syncope in patients with both diagnoses.  5. Pancytopenia: Pharmacy concerned with decreased WBC in setting of azathioprine treatment. They recommended stopping for several days and restarting upon follow-up appointment.   Significant Procedures: None.  Significant Labs and Imaging:   Recent Labs Lab 08/26/15 1546 08/27/15 0451 08/28/15 0506  WBC 4.5 3.7* 3.0*  HGB 9.9* 10.2* 9.8*  HCT 30.1* 30.5* 29.3*  PLT 148* 125* PENDING      Last Labs      Recent Labs Lab 08/26/15 1021 08/26/15 1546 08/27/15 0451 08/28/15 0506  NA 136 --  140 141  K 3.1* --  3.3* 3.5  CL 98* --  105 106  CO2 29 --  26 30  BUN 19 --  12 5*  CREATININE 0.93 0.81 0.65 0.65  CALCIUM 8.7* --  8.3* 8.3*  PROT --  --  6.8 6.5  BILITOT --  --  0.5 0.5  ALKPHOS --  --  61 60  ALT --  --  30 26  AST --  --  82* 64*  GLUCOSE 138* --  104* 100*         Iron Panel: Iron 59, TIBC 328, Ferritin 79, Saturation 18 Glucose: 100-104 Urinalysis: Amber color, hyaline casts, negative nitrites, negative leukocytes  Echocardiogram: - LVEf 60-65%, moderate LVH, normal wall motion, diastolic  dysfunction, elevated LV filling pressure, moderate aortic  stenosis - AVA around 1.4-1.5 cm2, normal LA size, elevated RA  pressure.  Results/Tests Pending at Time of Discharge: Peripheral smear  Discharge Medications:    Medication List    STOP taking these medications  amoxicillin-clavulanate 875-125 MG tablet  Commonly known as:  AUGMENTIN     carvedilol 6.25 MG tablet  Commonly known as:  COREG     furosemide 20 MG tablet  Commonly known as:  LASIX     hydrALAZINE 50 MG tablet  Commonly known as:  APRESOLINE      TAKE these medications        albuterol (2.5 MG/3ML) 0.083% nebulizer solution  Commonly known as:   PROVENTIL  Take 2.5 mg by nebulization every 6 (six) hours as needed for wheezing or shortness of breath.     albuterol 108 (90 Base) MCG/ACT inhaler  Commonly known as:  PROVENTIL HFA;VENTOLIN HFA  Inhale 1 puff into the lungs every 6 (six) hours as needed for wheezing.     amLODipine 2.5 MG tablet  Commonly known as:  NORVASC  Take 2.5 mg by mouth daily.     aspirin EC 81 MG tablet  Take 81 mg by mouth daily.     azaTHIOprine 50 MG tablet  Commonly known as:  IMURAN  Take 50 mg by mouth daily.     donepezil 10 MG tablet  Commonly known as:  ARICEPT  Take 10 mg by mouth at bedtime.     ergocalciferol 50000 units capsule  Commonly known as:  VITAMIN D2  Take 50,000 Units by mouth once a week. Reported on 08/27/2015     FERROUS SULFATE PO  Take 1 tablet by mouth daily.     Fluticasone-Salmeterol 100-50 MCG/DOSE Aepb  Commonly known as:  ADVAIR  Inhale 1 puff into the lungs 2 (two) times daily as needed. Reported on 08/27/2015     gabapentin 300 MG capsule  Commonly known as:  NEURONTIN  Take 300-600 mg by mouth See admin instructions. Pt takes 300mg  in the morning and 600mg  at bedtime     losartan 100 MG tablet  Commonly known as:  COZAAR  Take 100 mg by mouth daily.     meclizine 25 MG tablet  Commonly known as:  ANTIVERT  Take 25 mg by mouth 3 (three) times daily as needed for dizziness.     montelukast 10 MG tablet  Commonly known as:  SINGULAIR  Take 10 mg by mouth at bedtime.     multivitamin with minerals tablet  Take 1 tablet by mouth daily.     oseltamivir 75 MG capsule  Commonly known as:  TAMIFLU  Take 1 capsule (75 mg total) by mouth 2 (two) times daily.     pravastatin 20 MG tablet  Commonly known as:  PRAVACHOL  Take 20 mg by mouth every evening.        Discharge Instructions: Please refer to Patient Instructions section of EMR for full details.  Patient was counseled important signs and symptoms that should prompt return to medical care,  changes in medications, dietary instructions, activity restrictions, and follow up appointments.   Follow-Up Appointments: Patient will follow-up with her PCP this week. The patient's daughter sees the same PCP and will be in clinic tomorrow to discuss with her.     Archie Patten, MD 08/29/2015, 1:39 PM PGY-2, [facility]  This discharge summary was prepared with the assistance of MS4 Quin Hoop. I have made appropriate changes and included my personal physical exam.

## 2015-08-29 ENCOUNTER — Emergency Department (HOSPITAL_COMMUNITY): Payer: Commercial Managed Care - HMO

## 2015-08-29 ENCOUNTER — Inpatient Hospital Stay (HOSPITAL_COMMUNITY)
Admission: EM | Admit: 2015-08-29 | Discharge: 2015-09-04 | DRG: 024 | Disposition: A | Discharge status: Hospice | Payer: Commercial Managed Care - HMO | Attending: Pulmonary Disease | Admitting: Pulmonary Disease

## 2015-08-29 ENCOUNTER — Encounter (HOSPITAL_COMMUNITY): Payer: Self-pay

## 2015-08-29 DIAGNOSIS — Z66 Do not resuscitate: Secondary | ICD-10-CM | POA: Diagnosis present

## 2015-08-29 DIAGNOSIS — Z9981 Dependence on supplemental oxygen: Secondary | ICD-10-CM | POA: Diagnosis not present

## 2015-08-29 DIAGNOSIS — J45909 Unspecified asthma, uncomplicated: Secondary | ICD-10-CM | POA: Diagnosis present

## 2015-08-29 DIAGNOSIS — Z87442 Personal history of urinary calculi: Secondary | ICD-10-CM | POA: Diagnosis not present

## 2015-08-29 DIAGNOSIS — J111 Influenza due to unidentified influenza virus with other respiratory manifestations: Secondary | ICD-10-CM | POA: Diagnosis not present

## 2015-08-29 DIAGNOSIS — I629 Nontraumatic intracranial hemorrhage, unspecified: Secondary | ICD-10-CM

## 2015-08-29 DIAGNOSIS — C9110 Chronic lymphocytic leukemia of B-cell type not having achieved remission: Secondary | ICD-10-CM | POA: Diagnosis present

## 2015-08-29 DIAGNOSIS — E785 Hyperlipidemia, unspecified: Secondary | ICD-10-CM | POA: Diagnosis present

## 2015-08-29 DIAGNOSIS — I161 Hypertensive emergency: Secondary | ICD-10-CM | POA: Diagnosis present

## 2015-08-29 DIAGNOSIS — Z8673 Personal history of transient ischemic attack (TIA), and cerebral infarction without residual deficits: Secondary | ICD-10-CM | POA: Diagnosis not present

## 2015-08-29 DIAGNOSIS — E876 Hypokalemia: Secondary | ICD-10-CM | POA: Diagnosis present

## 2015-08-29 DIAGNOSIS — G9340 Encephalopathy, unspecified: Secondary | ICD-10-CM | POA: Diagnosis not present

## 2015-08-29 DIAGNOSIS — G4730 Sleep apnea, unspecified: Secondary | ICD-10-CM | POA: Diagnosis present

## 2015-08-29 DIAGNOSIS — Z7982 Long term (current) use of aspirin: Secondary | ICD-10-CM | POA: Diagnosis not present

## 2015-08-29 DIAGNOSIS — N179 Acute kidney failure, unspecified: Secondary | ICD-10-CM | POA: Diagnosis not present

## 2015-08-29 DIAGNOSIS — I609 Nontraumatic subarachnoid hemorrhage, unspecified: Principal | ICD-10-CM

## 2015-08-29 DIAGNOSIS — J9601 Acute respiratory failure with hypoxia: Secondary | ICD-10-CM | POA: Diagnosis not present

## 2015-08-29 DIAGNOSIS — R001 Bradycardia, unspecified: Secondary | ICD-10-CM | POA: Diagnosis present

## 2015-08-29 DIAGNOSIS — I10 Essential (primary) hypertension: Secondary | ICD-10-CM | POA: Diagnosis not present

## 2015-08-29 DIAGNOSIS — J101 Influenza due to other identified influenza virus with other respiratory manifestations: Secondary | ICD-10-CM | POA: Diagnosis present

## 2015-08-29 DIAGNOSIS — Z09 Encounter for follow-up examination after completed treatment for conditions other than malignant neoplasm: Secondary | ICD-10-CM

## 2015-08-29 DIAGNOSIS — G919 Hydrocephalus, unspecified: Secondary | ICD-10-CM | POA: Diagnosis present

## 2015-08-29 DIAGNOSIS — I615 Nontraumatic intracerebral hemorrhage, intraventricular: Secondary | ICD-10-CM | POA: Diagnosis present

## 2015-08-29 DIAGNOSIS — G9389 Other specified disorders of brain: Secondary | ICD-10-CM | POA: Diagnosis present

## 2015-08-29 DIAGNOSIS — Z515 Encounter for palliative care: Secondary | ICD-10-CM | POA: Insufficient documentation

## 2015-08-29 DIAGNOSIS — Z79899 Other long term (current) drug therapy: Secondary | ICD-10-CM

## 2015-08-29 DIAGNOSIS — Z4659 Encounter for fitting and adjustment of other gastrointestinal appliance and device: Secondary | ICD-10-CM

## 2015-08-29 LAB — TROPONIN I
TROPONIN I: 0.03 ng/mL (ref <0.031)
TROPONIN I: 0.05 ng/mL — AB (ref <0.031)

## 2015-08-29 LAB — COMPREHENSIVE METABOLIC PANEL
ALT: 31 U/L (ref 14–54)
AST: 62 U/L — AB (ref 15–41)
Albumin: 3.3 g/dL — ABNORMAL LOW (ref 3.5–5.0)
Alkaline Phosphatase: 86 U/L (ref 38–126)
Anion gap: 16 — ABNORMAL HIGH (ref 5–15)
BUN: 7 mg/dL (ref 6–20)
CHLORIDE: 93 mmol/L — AB (ref 101–111)
CO2: 27 mmol/L (ref 22–32)
Calcium: 9.3 mg/dL (ref 8.9–10.3)
Creatinine, Ser: 0.68 mg/dL (ref 0.44–1.00)
GFR calc Af Amer: >60 mL/min (ref >60)
Glucose, Bld: 201 mg/dL — ABNORMAL HIGH (ref 65–99)
POTASSIUM: 3.3 mmol/L — AB (ref 3.5–5.1)
SODIUM: 136 mmol/L (ref 135–145)
Total Bilirubin: 0.8 mg/dL (ref 0.3–1.2)
Total Protein: 9.2 g/dL — ABNORMAL HIGH (ref 6.5–8.1)

## 2015-08-29 LAB — I-STAT ARTERIAL BLOOD GAS, ED
Acid-Base Excess: 7 mmol/L — ABNORMAL HIGH (ref 0.0–2.0)
Bicarbonate: 30.1 mEq/L — ABNORMAL HIGH (ref 20.0–24.0)
O2 SAT: 98 %
PCO2 ART: 37.5 mmHg (ref 35.0–45.0)
PH ART: 7.512 — AB (ref 7.350–7.450)
PO2 ART: 90 mmHg (ref 80.0–100.0)
Patient temperature: 98.6
TCO2: 31 mmol/L (ref 0–100)

## 2015-08-29 LAB — CBC WITH DIFFERENTIAL/PLATELET
BASOS ABS: 0.1 10*3/uL (ref 0.0–0.1)
BASOS PCT: 1 %
EOS ABS: 0 10*3/uL (ref 0.0–0.7)
Eosinophils Relative: 0 %
HCT: 40.5 % (ref 36.0–46.0)
Hemoglobin: 14.1 g/dL (ref 12.0–15.0)
LYMPHS PCT: 22 %
Lymphs Abs: 1.5 10*3/uL (ref 0.7–4.0)
MCH: 32.6 pg (ref 26.0–34.0)
MCHC: 34.8 g/dL (ref 30.0–36.0)
MCV: 93.8 fL (ref 78.0–100.0)
MONO ABS: 0.5 10*3/uL (ref 0.1–1.0)
Monocytes Relative: 7 %
NEUTROS PCT: 70 %
Neutro Abs: 4.6 10*3/uL (ref 1.7–7.7)
PLATELETS: 174 10*3/uL (ref 150–400)
RBC: 4.32 MIL/uL (ref 3.87–5.11)
RDW: 13.5 % (ref 11.5–15.5)
WBC: 6.7 10*3/uL (ref 4.0–10.5)

## 2015-08-29 LAB — I-STAT CHEM 8, ED
BUN: 9 mg/dL (ref 6–20)
CREATININE: 0.6 mg/dL (ref 0.44–1.00)
Calcium, Ion: 1 mmol/L — ABNORMAL LOW (ref 1.13–1.30)
Chloride: 91 mmol/L — ABNORMAL LOW (ref 101–111)
GLUCOSE: 209 mg/dL — AB (ref 65–99)
HCT: 50 % — ABNORMAL HIGH (ref 36.0–46.0)
HEMOGLOBIN: 17 g/dL — AB (ref 12.0–15.0)
POTASSIUM: 3.2 mmol/L — AB (ref 3.5–5.1)
Sodium: 137 mmol/L (ref 135–145)
TCO2: 33 mmol/L (ref 0–100)

## 2015-08-29 LAB — URINALYSIS, ROUTINE W REFLEX MICROSCOPIC
Bilirubin Urine: NEGATIVE
GLUCOSE, UA: 250 mg/dL — AB
Ketones, ur: 15 mg/dL — AB
LEUKOCYTES UA: NEGATIVE
Nitrite: NEGATIVE
PH: 7.5 (ref 5.0–8.0)
SPECIFIC GRAVITY, URINE: 1.022 (ref 1.005–1.030)

## 2015-08-29 LAB — CORTISOL: Cortisol, Plasma: 69.8 ug/dL

## 2015-08-29 LAB — CBG MONITORING, ED
GLUCOSE-CAPILLARY: 200 mg/dL — AB (ref 65–99)
GLUCOSE-CAPILLARY: 173 mg/dL — AB (ref 65–99)

## 2015-08-29 LAB — LACTIC ACID, PLASMA: LACTIC ACID, VENOUS: 1.4 mmol/L (ref 0.5–2.0)

## 2015-08-29 LAB — URINE MICROSCOPIC-ADD ON

## 2015-08-29 LAB — APTT: APTT: 32 s (ref 24–37)

## 2015-08-29 LAB — PROTIME-INR
INR: 1.03 (ref 0.00–1.49)
PROTHROMBIN TIME: 13.7 s (ref 11.6–15.2)

## 2015-08-29 LAB — BRAIN NATRIURETIC PEPTIDE: B Natriuretic Peptide: 285.4 pg/mL — ABNORMAL HIGH (ref 0.0–100.0)

## 2015-08-29 LAB — PROCALCITONIN: Procalcitonin: <0.1 ng/mL

## 2015-08-29 LAB — MRSA PCR SCREENING: MRSA by PCR: NEGATIVE

## 2015-08-29 LAB — I-STAT CG4 LACTIC ACID, ED: Lactic Acid, Venous: 1.98 mmol/L (ref 0.5–2.0)

## 2015-08-29 MED ORDER — SODIUM CHLORIDE 0.9% FLUSH
3.0000 mL | Freq: Two times a day (BID) | INTRAVENOUS | Status: DC
  Administered 2015-08-29 – 2015-09-04 (×9): 3 mL via INTRAVENOUS

## 2015-08-29 MED ORDER — SODIUM CHLORIDE 0.9 % IV SOLN: 250.0000 mL | INTRAVENOUS | Status: DC | PRN

## 2015-08-29 MED ORDER — NICARDIPINE HCL IN NACL 20-0.86 MG/200ML-% IV SOLN
INTRAVENOUS | Status: AC
  Filled 2015-08-29: qty 200

## 2015-08-29 MED ORDER — IOHEXOL 350 MG/ML SOLN
50.0000 mL | Freq: Once | INTRAVENOUS | Status: AC | PRN
  Administered 2015-08-29: 50 mL via INTRAVENOUS

## 2015-08-29 MED ORDER — NICARDIPINE HCL IN NACL 20-0.86 MG/200ML-% IV SOLN
3.0000 mg/h | Freq: Once | INTRAVENOUS | Status: AC
  Administered 2015-08-29: 3 mg/h via INTRAVENOUS
  Filled 2015-08-29: qty 200

## 2015-08-29 MED ORDER — DEXTROSE 5 % IV SOLN
2.0000 g | Freq: Once | INTRAVENOUS | Status: AC
  Administered 2015-08-29: 2 g via INTRAVENOUS
  Filled 2015-08-29: qty 2

## 2015-08-29 MED ORDER — SODIUM CHLORIDE 0.9% FLUSH
3.0000 mL | INTRAVENOUS | Status: DC | PRN
  Administered 2015-08-30 – 2015-09-02 (×2): 3 mL via INTRAVENOUS
  Filled 2015-08-29 (×2): qty 3

## 2015-08-29 MED ORDER — OSELTAMIVIR PHOSPHATE 6 MG/ML PO SUSR
75.0000 mg | Freq: Two times a day (BID) | ORAL | Status: DC
  Administered 2015-08-30: 75 mg via ORAL
  Filled 2015-08-29 (×3): qty 12.5

## 2015-08-29 MED ORDER — DEXTROSE 5 % IV SOLN
2.0000 g | INTRAVENOUS | Status: DC
  Administered 2015-08-30 – 2015-09-04 (×6): 2 g via INTRAVENOUS
  Filled 2015-08-29 (×6): qty 2

## 2015-08-29 MED ORDER — DEXTROSE 5 % IV SOLN: 2.0000 g | Freq: Once | INTRAVENOUS | Status: DC

## 2015-08-29 NOTE — ED Notes (Signed)
RN at bedside to obtain ultrasound IV for CT

## 2015-08-29 NOTE — ED Notes (Signed)
Patient returned to room from ct.

## 2015-08-29 NOTE — Op Note (Signed)
*   No surgery found *  6:53 PM  PATIENT:  Ann Beasley  82 y.o. female  PRE-OPERATIVE DIAGNOSIS:  Subarachnoid hemorrhage, hydrocephalus  POST-OPERATIVE DIAGNOSIS:  Same  PROCEDURE:  Right frontal external ventricular drain placement  SURGEON:  Aldean Ast, MD  ASSISTANTS: N/A  ANESTHESIA:   General  DRAINS: Right frontal EVD  SPECIMEN:  None  INDICATION FOR PROCEDURE: Patient with subarachnoid hemorrhage and hydrocephalus.  Family understood the risks, benefits, and alternatives and potential outcomes and wished to proceed.  PROCEDURE DETAILS: The patient was prepped and draped in a sterile fashion.  The skin was anesthetized.  An incision was made at [facility] point on the right.  The periosteal was scraped away and a burr hole drilled with a twist drill.  Bone chips were removed and irrigated out of the burr hole.  The dura was sharply incised.  The ventricular catheter was passed to a depth of about 6cm.  There was brisk return of blood tinged CSF.  The catheter was tunneled posteriomedially and secured at the skin.  The incision was closed with staples. A tension loop was fashioned.  The wound was dressed.  PATIENT DISPOSITION:  ICU   Delay start of Pharmacological VTE agent (>24hrs) due to surgical blood loss or risk of bleeding:  yes

## 2015-08-29 NOTE — ED Notes (Signed)
Pt returned to room from [place], with Loma Sousa, Therapist, sports.

## 2015-08-29 NOTE — Consult Note (Addendum)
CC:  Chief Complaint  Patient presents with  . Altered Mental Status  . Diarrhea    HPI: Ann Beasley is a 82 y.o. female with multiple comorbidities presents with depressed mental status and CT findings of subarachnoid hemorrhage with hydrocephalus.  PMH: Past Medical History  Diagnosis Date  . HTN (hypertension)   . Angina at rest [facility])     history of  . Asthma   . Hyperlipidemia   . Stroke ([place])   . Kidney stones     "passed it"  . On home oxygen therapy     "2L 24/7" (08/26/2015)  . Sleep apnea     "suppose to wear mask; doesn't" (08/26/2015)  . History of stomach ulcers   . TIA (transient ischemic attack) "several"  . Arthritis     "knees, hands" (08/26/2015)  . CLL (chronic lymphocytic leukemia) ([place]) dx'd ~ 2010    "stable" (08/26/2015)    PSH: Past Surgical History  Procedure Laterality Date  . Right heart catheterization N/A 03/16/2013    Procedure: RIGHT HEART CATH;  Surgeon: Jolaine Artist, MD;  Location: [facility] CATH LAB;  Service: Cardiovascular;  Laterality: N/A;  . Cardiac catheterization    . Laparoscopic cholecystectomy      SH: Social History  Substance Use Topics  . Smoking status: Never Smoker   . Smokeless tobacco: Never Used  . Alcohol Use: No    MEDS: Prior to Admission medications   Medication Sig Start Date End Date Taking? Authorizing Provider  albuterol (PROVENTIL HFA;VENTOLIN HFA) 108 (90 BASE) MCG/ACT inhaler Inhale 1 puff into the lungs every 6 (six) hours as needed for wheezing.    Yes Historical Provider, MD  albuterol (PROVENTIL) (2.5 MG/3ML) 0.083% nebulizer solution Take 2.5 mg by nebulization every 6 (six) hours as needed for wheezing or shortness of breath.   Yes Historical Provider, MD  ALPRAZolam (XANAX) 0.25 MG tablet Take 0.25 mg by mouth 3 (three) times daily as needed for anxiety.   Yes Historical Provider, MD  amLODipine (NORVASC) 2.5 MG tablet Take 2.5 mg by mouth daily.   Yes Historical Provider, MD  aspirin EC 81  MG tablet Take 81 mg by mouth daily.    Yes Historical Provider, MD  azaTHIOprine (IMURAN) 50 MG tablet Take 50 mg by mouth daily.   Yes Historical Provider, MD  donepezil (ARICEPT) 10 MG tablet Take 10 mg by mouth at bedtime.   Yes Historical Provider, MD  ergocalciferol (VITAMIN D2) 50000 UNITS capsule Take 50,000 Units by mouth once a week. Reported on 08/27/2015   Yes Historical Provider, MD  FERROUS SULFATE PO Take 1 tablet by mouth daily.   Yes Historical Provider, MD  Fluticasone-Salmeterol (ADVAIR) 100-50 MCG/DOSE AEPB Inhale 1 puff into the lungs 2 (two) times daily as needed. Reported on 08/27/2015   Yes Historical Provider, MD  gabapentin (NEURONTIN) 300 MG capsule Take 300-600 mg by mouth See admin instructions. Pt takes 300mg  in the morning and 600mg  at bedtime   Yes Historical Provider, MD  LORazepam (ATIVAN) 0.5 MG tablet Take 0.25 mg by mouth 2 (two) times daily as needed for anxiety.   Yes Historical Provider, MD  losartan (COZAAR) 100 MG tablet Take 100 mg by mouth daily.   Yes Historical Provider, MD  meclizine (ANTIVERT) 25 MG tablet Take 25 mg by mouth 3 (three) times daily as needed for dizziness.    Yes Historical Provider, MD  montelukast (SINGULAIR) 10 MG tablet Take 10 mg by mouth at bedtime.  Yes Historical Provider, MD  Multiple Vitamins-Minerals (MULTIVITAMIN WITH MINERALS) tablet Take 1 tablet by mouth daily.   Yes Historical Provider, MD  oseltamivir (TAMIFLU) 75 MG capsule Take 1 capsule (75 mg total) by mouth 2 (two) times daily. 08/28/15  Yes Smiley Houseman, MD  pravastatin (PRAVACHOL) 20 MG tablet Take 20 mg by mouth every evening.    Yes Historical Provider, MD  sulfamethoxazole-trimethoprim (BACTRIM DS,SEPTRA DS) 800-160 MG tablet Take 1 tablet by mouth 3 (three) times a week.   Yes Historical Provider, MD  traMADol (ULTRAM) 50 MG tablet Take 50 mg by mouth every 6 (six) hours.   Yes Historical Provider, MD    ALLERGY: No Known Allergies  ROS: ROS   Cannot obtain because patient is unarousable.  NEUROLOGIC EXAM: Unresponsive PERRL Corneals intact Face symmetric Withdraws bilaterally  IMAGING: Subarachnoid hemorrhage with IVH.  Most SAH is in posterior fossa, ventral and lateral to the brainstem.  CTA does not show any intracranial aneurysms or AVM but does reveal ectatic vasculature.  IMPRESSION: - 82 y.o. female with HH4, [place] 4 SAH.  Symptomatic hydrocephalus.  PLAN: - I had a long discussion with the family regarding her prognosis.  I think that even with maximal care she will likely end up in a dependent state and require nursing home care.  I have offered to place an EVD to treat the patient's hydrocephalus.  The family is considering this.  I will wait to hear the results of their discussion. - If the patient is admitted, please admit her to the PCCM service given her age, co-morbidities, and absence of surgically relevant vascular pathology.

## 2015-08-29 NOTE — ED Notes (Signed)
[facility] transported pt to CT.

## 2015-08-29 NOTE — Progress Notes (Signed)
MEDICATION RELATED CONSULT NOTE - INITIAL   Pharmacy Consult for antibiotic renal dosing adjustment  No Known Allergies  Patient Measurements: Height: 5\' 7"  (170.2 cm) Weight: 220 lb (99.791 kg) IBW/kg (Calculated) : 61.6  Vital Signs: Temp: 98 F (36.7 C) (03/31 1028) Temp Source: Rectal (03/31 1028) BP: 131/63 mmHg (03/31 1530) Pulse Rate: 103 (03/31 1530) Intake/Output from previous day:   Intake/Output from this shift: Total I/O In: -  Out: 225 [Urine:225]  Labs:  Recent Labs  08/27/15 0451 08/28/15 0506 08/29/15 1010 08/29/15 1044  WBC 3.7* 3.0*  --  6.7  HGB 10.2* 9.8* 17.0* 14.1  HCT 30.5* 29.3* 50.0* 40.5  PLT 125* 112*  --  174  CREATININE 0.65 0.65 0.60 0.68  ALBUMIN 2.6* 2.4*  --  3.3*  PROT 6.8 6.5  --  9.2*  AST 82* 64*  --  62*  ALT 30 26  --  31  ALKPHOS 61 60  --  86  BILITOT 0.5 0.5  --  0.8   Estimated Creatinine Clearance: 67 mL/min (by C-G formula based on Cr of 0.68).   Microbiology: No results found for this or any previous visit (from the past 720 hour(s)).  Medical History: Past Medical History  Diagnosis Date  . HTN (hypertension)   . Angina at rest [facility])     history of  . Asthma   . Hyperlipidemia   . Stroke ([place])   . Kidney stones     "passed it"  . On home oxygen therapy     "2L 24/7" (08/26/2015)  . Sleep apnea     "suppose to wear mask; doesn't" (08/26/2015)  . History of stomach ulcers   . TIA (transient ischemic attack) "several"  . Arthritis     "knees, hands" (08/26/2015)  . CLL (chronic lymphocytic leukemia) ([place]) dx'd ~ 2010    "stable" (08/26/2015)    Assessment: 83 yof admitted 08/29/2015  With AMS. Found to have [place] with IVH. EVD placed for drainage. PT currently being treated with tamiflu and received ceftriaxone 2g IV x1 in the ED. WBC 6.7, CrCl ~13mL/min.  Plan:  Continue Tamiflu 75mg  BID (oral suspension) F/U renal fxn, clinical course   Cornel Werber C. Lennox Grumbles, PharmD Pharmacy Resident  Pager:  [phone] 08/29/2015 6:45 PM

## 2015-08-29 NOTE — H&P (Signed)
PULMONARY / CRITICAL CARE MEDICINE   Name: Ann Beasley MRN: [id number] DOB: 1931/11/23    ADMISSION DATE:  08/29/2015 CONSULTATION DATE:  08/29/15  REFERRING MD:  ED  CHIEF COMPLAINT: Altered mental status, concern for CVA  HISTORY OF PRESENT ILLNESS:   Ann Beasley is a 82 year old with past mental history of hypertension, asthma, sleep apnea, kidney stones, CLL. She is admitted from 08/26/15 to 08/28/15 with syncope, altered mental status. Found to have a new diagnosis of Flu infection moderate AS. She returns in one day with altered mental status. Found to have a large SAH. Neurosurgery consulted. Started on Nicardipine drip for HTN (BP 210/105).  PAST MEDICAL HISTORY :  She  has a past medical history of HTN (hypertension); Angina at rest [facility]); Asthma; Hyperlipidemia; Stroke [facility]); Kidney stones; On home oxygen therapy; Sleep apnea; History of stomach ulcers; TIA (transient ischemic attack) ("several"); Arthritis; and CLL (chronic lymphocytic leukemia) ([place]) (dx'd ~ 2010).  PAST SURGICAL HISTORY: She  has past surgical history that includes right heart catheterization (N/A, 03/16/2013); Cardiac catheterization; and Laparoscopic cholecystectomy.  No Known Allergies  No current facility-administered medications on file prior to encounter.   Current Outpatient Prescriptions on File Prior to Encounter  Medication Sig  . albuterol (PROVENTIL HFA;VENTOLIN HFA) 108 (90 BASE) MCG/ACT inhaler Inhale 1 puff into the lungs every 6 (six) hours as needed for wheezing.   Marland Kitchen albuterol (PROVENTIL) (2.5 MG/3ML) 0.083% nebulizer solution Take 2.5 mg by nebulization every 6 (six) hours as needed for wheezing or shortness of breath.  Marland Kitchen amLODipine (NORVASC) 2.5 MG tablet Take 2.5 mg by mouth daily.  Marland Kitchen aspirin EC 81 MG tablet Take 81 mg by mouth daily.   Marland Kitchen azaTHIOprine (IMURAN) 50 MG tablet Take 50 mg by mouth daily.  Marland Kitchen donepezil (ARICEPT) 10 MG tablet Take 10 mg by mouth at bedtime.  .  ergocalciferol (VITAMIN D2) 50000 UNITS capsule Take 50,000 Units by mouth once a week. Reported on 08/27/2015  . FERROUS SULFATE PO Take 1 tablet by mouth daily.  . Fluticasone-Salmeterol (ADVAIR) 100-50 MCG/DOSE AEPB Inhale 1 puff into the lungs 2 (two) times daily as needed. Reported on 08/27/2015  . gabapentin (NEURONTIN) 300 MG capsule Take 300-600 mg by mouth See admin instructions. Pt takes 300mg  in the morning and 600mg  at bedtime  . losartan (COZAAR) 100 MG tablet Take 100 mg by mouth daily.  . meclizine (ANTIVERT) 25 MG tablet Take 25 mg by mouth 3 (three) times daily as needed for dizziness.   . montelukast (SINGULAIR) 10 MG tablet Take 10 mg by mouth at bedtime.  . Multiple Vitamins-Minerals (MULTIVITAMIN WITH MINERALS) tablet Take 1 tablet by mouth daily.  Marland Kitchen oseltamivir (TAMIFLU) 75 MG capsule Take 1 capsule (75 mg total) by mouth 2 (two) times daily.  . pravastatin (PRAVACHOL) 20 MG tablet Take 20 mg by mouth every evening.     FAMILY HISTORY:  Her has no family status information on file.   SOCIAL HISTORY: She  reports that she has never smoked. She has never used smokeless tobacco. She reports that she does not drink alcohol or use illicit drugs.  REVIEW OF SYSTEMS:   Unable to obtain as patient is unresponsive.  SUBJECTIVE:  3/31- Admit with SAH.  VITAL SIGNS: BP 126/53 mmHg  Pulse 80  Temp(Src) 98 F (36.7 C) (Rectal)  Resp 37  Ht 5\' 7"  (1.702 m)  Wt 220 lb (99.791 kg)  BMI 34.45 kg/m2  SpO2 94%  HEMODYNAMICS:    VENTILATOR  SETTINGS:    INTAKE / OUTPUT:    PHYSICAL EXAMINATION: General:  Unresponsive, no distress Neuro:  Equal, reactive pupils. HEENT:  No thyromegaly, JVD Cardiovascular:  Regular rate and rhythm, no murmurs rubs gallops Lungs:  Clear, no wheeze, crackles Abdomen:  Soft, positive bowel sounds Musculoskeletal: Normal tone and bulk Skin:  Intact  LABS:  BMET  Recent Labs Lab 08/27/15 0451 08/28/15 0506 08/29/15 1010  08/29/15 1044  NA 140 141 137 136  K 3.3* 3.5 3.2* 3.3*  CL 105 106 91* 93*  CO2 26 30  --  27  BUN 12 5* 9 7  CREATININE 0.65 0.65 0.60 0.68  GLUCOSE 104* 100* 209* 201*    Electrolytes  Recent Labs Lab 08/27/15 0451 08/28/15 0506 08/29/15 1044  CALCIUM 8.3* 8.3* 9.3    CBC  Recent Labs Lab 08/27/15 0451 08/28/15 0506 08/29/15 1010 08/29/15 1044  WBC 3.7* 3.0*  --  6.7  HGB 10.2* 9.8* 17.0* 14.1  HCT 30.5* 29.3* 50.0* 40.5  PLT 125* 112*  --  174    Coag's No results for input(s): APTT, INR in the last 168 hours.  Sepsis Markers  Recent Labs Lab 08/26/15 1037 08/29/15 1010  LATICACIDVEN 1.62 1.98    ABG No results for input(s): PHART, PCO2ART, PO2ART in the last 168 hours.  Liver Enzymes  Recent Labs Lab 08/27/15 0451 08/28/15 0506 08/29/15 1044  AST 82* 64* 62*  ALT 30 26 31   ALKPHOS 61 60 86  BILITOT 0.5 0.5 0.8  ALBUMIN 2.6* 2.4* 3.3*    Cardiac Enzymes  Recent Labs Lab 08/29/15 1044  TROPONINI <0.03    Glucose  Recent Labs Lab 08/29/15 0947 08/29/15 1210  GLUCAP 200* 173*    Imaging Ct Angio Head W/cm &/or Wo Cm  08/29/2015  CLINICAL DATA:  82 year old female code stroke patient with acute intracranial hemorrhage and ventriculomegaly. Possible aneurysm rupture. Initial encounter. EXAM: CT ANGIOGRAPHY HEAD TECHNIQUE: Multidetector CT imaging of the head was performed using the standard protocol during bolus administration of intravenous contrast. Multiplanar CT image reconstructions and MIPs were obtained to evaluate the vascular anatomy. CONTRAST:  66mL OMNIPAQUE IOHEXOL 350 MG/ML SOLN COMPARISON:  Head CT without contrast 1101 hours today. FINDINGS: Posterior circulation: Mildly dominant right vertebral artery in the visualized upper neck and at the skullbase. Tortuous but otherwise negative distal cervical vertebral arteries. Normal PICA origins. Normal vertebrobasilar junction. The basilar artery is patent without stenosis.  There is mild ectasia of the basilar artery, but SCA and PCA origins are normal. Posterior communicating arteries are diminutive. Bilateral PCA branches are normal. Anterior circulation: Tortuous distal cervical ICAs. Mildly beaded appearance of the distal left cervical ICA (series 20, image 28). Similar beaded appearance on the right most notable on series 19, image 21. Both ICA siphons are patent. Bilateral calcified ICA siphon plaque. No siphon stenosis. Ophthalmic artery origins are within normal limits. There are small infundibula at both posterior communicating artery origins which otherwise are normal. Mildly ectatic carotid termini. Normal MCA and ACA origins. Mild ectasia of the anterior communicating artery without saccular aneurysm. Tortuous but otherwise normal bilateral ACA branches. Left MCA M1 segment, left MCA bifurcation, and left MCA branches are within normal limits. Right MCA M1 segment, bifurcation, and right MCA branches are within normal limits. Venous sinuses: Patent. Anatomic variants: None. Delayed phase:Volume of intracranial hemorrhage appears stable from 1101 hours today. Stable ventriculomegaly. No abnormal enhancement identified. IMPRESSION: 1. Negative for intracranial aneurysm or emergent large vessel occlusion. There  is generalized intracranial dolichoectasia and also evidence of mild bilateral cervical ICA fibromuscular dysplasia. 2. Stable Intracranial Hemorrhage and Ventriculomegaly since 1101 hours today. 3. Calcified ICA siphon atherosclerosis, but no intracranial stenosis. Electronically Signed   By: Genevie Ann M.D.   On: 08/29/2015 14:29   Ct Head Wo Contrast  08/29/2015  CLINICAL DATA:  82 year old female with left side weakness. Altered mental status. Code stroke. Initial encounter. EXAM: CT HEAD WITHOUT CONTRAST TECHNIQUE: Contiguous axial images were obtained from the base of the skull through the vertex without intravenous contrast. COMPARISON:  08/26/2015. FINDINGS:  Increased fluid levels in the left maxillary and sphenoid sinuses. Continued opacification and bubbly opacity in the ethmoid and left frontal sinuses. Tympanic cavities and mastoids are clear. No acute osseous abnormality identified. Bilateral dural calcifications re- demonstrated. Visualized orbits and scalp soft tissues are within normal limits. Mixed density intraventricular hemorrhage with ventriculomegaly is new from the recent comparison. Moderate volume of intraventricular hemorrhage, including in the fourth ventricle. Furthermore, furthermore there is basilar cistern subarachnoid hemorrhage, including in the suprasellar cistern. Posterior fossa basilar hemorrhage is majority to the left of midline, extending to midline. This is present at the left cisterna magna at near the left foramen of Luschka. No superimposed intra-axial hemorrhage is identified. Preexisting confluent cerebral white matter hypodensity has not significantly changed, but there is suggestion of new indistinct cortical hypodensity in both superior hemispheres (series 21, image 25). No subdural or epidural blood identified. Trace rightward midline shift. No herniation at this time. IMPRESSION: 1. Acute intracranial hemorrhage. Basilar cistern subarachnoid hemorrhage and moderate volume of intraventricular hemorrhage. No intra-axial bleed identified. This hemorrhage pattern is indeterminate but highly suspicious for aneurysmal rupture. Recommend CTA Head with contrast to evaluate further. 2. Acute ventriculomegaly.   Recommend Neurosurgical consultation. 3. Possible superior cortical edema both hemispheres. Brain MRI would evaluate this finding further. 4. Superimposed progressed changes of acute paranasal sinusitis since 08/26/2015. Critical Value/emergent results were called by telephone at the time of interpretation on 08/29/2015 at 11:21 am to Dr. Tanna Furry , who verbally acknowledged these results. Electronically Signed   By: Genevie Ann  M.D.   On: 08/29/2015 11:23   Dg Chest Port 1 View  08/29/2015  CLINICAL DATA:  82 year old female with weakness, bradycardia. Initial encounter. EXAM: PORTABLE CHEST 1 VIEW COMPARISON:  08/26/2015 and earlier. FINDINGS: Portable AP semi upright view at 1003 hours. Lower lung volumes. Allowing for portable technique, the lungs are clear. Stable cardiac size and mediastinal contours. Stable visualized osseous structures. Calcified aortic atherosclerosis. IMPRESSION: Lower lung volumes, otherwise no acute cardiopulmonary abnormality. Electronically Signed   By: Genevie Ann M.D.   On: 08/29/2015 10:12     STUDIES:  CT head 3/31 > acute intracranial hemorrhage with subarachnoid and intraventricular blood. Acute ventriculomegaly. CTA head 3/31 > negative for aneurysm  CULTURES:   ANTIBIOTICS: Tamiflu   SIGNIFICANT EVENTS:   LINES/TUBES:   DISCUSSION: 82 year old with complicated past medical history. Recent admission with syncope, flu a infection. She is now readmitted 1 day after discharge with acute altered mental status. Found to have large subarachnoid, intraventricular hemorrhage with ventriculomegaly. No evidence of arachnoid rupture on CTA  ASSESSMENT / PLAN:  PULMONARY A: Flu a infection. Asthma Sleep apnea P:   Able to protect airway. Continue to monitor. Continue Tamiflu for at least 2 more days. Supplemental oxygen Nebulizer when necessary  CARDIOVASCULAR A:  Hypertensive emergency P:  Nicardipine drip. Goal SBP 100-120.   RENAL A:   Stable P:  Monitor urine output and creatinine  GASTROINTESTINAL A:   Stable P:   Nothing by mouth  HEMATOLOGIC A:   Stable P:  Monitor  INFECTIOUS A:   Recent flu a infection P:   Continue Tamiflu  ENDOCRINE A:   No issues P:   SSI  NEUROLOGIC A:   Altered mental status. Subarachnoid, intraventricular hemorrhage P:   And neurosurgery following. Make get a drain BP control with nicardipine.  FAMILY   - Updates: Family updated at bedside. Extensive discussion about goals of care were made. They are okay with temporary drain. They however realistic chances of recovery. They want to give his a day or 2. They however did not want any life sustaining measures or a permanent drain. Her code status has been changed to DNR/DNI. If her condition were to deteriorate then they are ok with comfort measures.   - Inter-disciplinary family meet or Palliative Care meeting due by:  4/7. Critical care time- 40 mins.  Marshell Garfinkel MD [facility] Pager [phone] If no answer or after 3pm call: [phone] 08/29/2015, 3:54 PM

## 2015-08-29 NOTE — ED Notes (Signed)
Pt presents to er via ems, patients family called 22 the patient was altered when they woke her up this morning, she was alert and oriented and talking last night at 2100, she is non-verbal and will not follow commands at this time, blood sugar is 200

## 2015-08-29 NOTE — ED Provider Notes (Signed)
CSN: [id number]     Arrival date & time 08/29/15  0940 History   First MD Initiated Contact with Patient 08/29/15 [phone]     Chief Complaint  Patient presents with  . Altered Mental Status  . Diarrhea       HPI  Patient presents via EMS from home this morning with a chief complaint of altered level of consciousness. Last time known well was last p.m. approximately 10 PM upon going to bed.  Recent history includes an ER clinic visit at [facility] last weekend, 25th or 26. Apparently diagnosed with influenza. Was discharged home on Tamiflu. Apparently had been ambulatory independently prior to that. Has been requiring assistance of family including a rolling walker at home since that time. Had had some right leg weakness as well.  Presented here 3 days ago, Tuesday 3/28.  At that time described weakness. Had an episode of unresponsiveness at her home. Was admitted. Did not have any additional episodes. No arrhythmias. No episodes to suggest syncope.  CT showed no acute findings. No MRI obtained.   Para this morning upon awaking the patient was unable to speak. Noted to have right-sided weakness. Difficult to tell whether she was understanding oversedation, per the family. Would not follow commands. Brought by EMS. Normal blood sugar. Sinus rhythm.   Past Medical History  Diagnosis Date  . HTN (hypertension)   . Angina at rest [facility])     history of  . Asthma   . Hyperlipidemia   . Stroke ([place])   . Kidney stones     "passed it"  . On home oxygen therapy     "2L 24/7" (08/26/2015)  . Sleep apnea     "suppose to wear mask; doesn't" (08/26/2015)  . History of stomach ulcers   . TIA (transient ischemic attack) "several"  . Arthritis     "knees, hands" (08/26/2015)  . CLL (chronic lymphocytic leukemia) ([place]) dx'd ~ 2010    "stable" (08/26/2015)   Past Surgical History  Procedure Laterality Date  . Right heart catheterization N/A 03/16/2013    Procedure: RIGHT HEART CATH;   Surgeon: Jolaine Artist, MD;  Location: [facility] CATH LAB;  Service: Cardiovascular;  Laterality: N/A;  . Cardiac catheterization    . Laparoscopic cholecystectomy     Family History  Problem Relation Age of Onset  . Hypertension Father   . Bone cancer Father     multiple type of cancers  . Heart disease Mother    Social History  Substance Use Topics  . Smoking status: Never Smoker   . Smokeless tobacco: Never Used  . Alcohol Use: No   OB History    No data available     Review of Systems  Unable to perform ROS: Patient nonverbal      Allergies  Review of patient's allergies indicates no known allergies.  Home Medications   Prior to Admission medications   Medication Sig Start Date End Date Taking? Authorizing Provider  albuterol (PROVENTIL HFA;VENTOLIN HFA) 108 (90 BASE) MCG/ACT inhaler Inhale 1 puff into the lungs every 6 (six) hours as needed for wheezing.    Yes Historical Provider, MD  albuterol (PROVENTIL) (2.5 MG/3ML) 0.083% nebulizer solution Take 2.5 mg by nebulization every 6 (six) hours as needed for wheezing or shortness of breath.   Yes Historical Provider, MD  ALPRAZolam (XANAX) 0.25 MG tablet Take 0.25 mg by mouth 3 (three) times daily as needed for anxiety.   Yes Historical Provider, MD  amLODipine (NORVASC)  2.5 MG tablet Take 2.5 mg by mouth daily.   Yes Historical Provider, MD  aspirin EC 81 MG tablet Take 81 mg by mouth daily.    Yes Historical Provider, MD  azaTHIOprine (IMURAN) 50 MG tablet Take 50 mg by mouth daily.   Yes Historical Provider, MD  donepezil (ARICEPT) 10 MG tablet Take 10 mg by mouth at bedtime.   Yes Historical Provider, MD  ergocalciferol (VITAMIN D2) 50000 UNITS capsule Take 50,000 Units by mouth once a week. Reported on 08/27/2015   Yes Historical Provider, MD  FERROUS SULFATE PO Take 1 tablet by mouth daily.   Yes Historical Provider, MD  Fluticasone-Salmeterol (ADVAIR) 100-50 MCG/DOSE AEPB Inhale 1 puff into the lungs 2 (two) times  daily as needed. Reported on 08/27/2015   Yes Historical Provider, MD  gabapentin (NEURONTIN) 300 MG capsule Take 300-600 mg by mouth See admin instructions. Pt takes 300mg  in the morning and 600mg  at bedtime   Yes Historical Provider, MD  LORazepam (ATIVAN) 0.5 MG tablet Take 0.25 mg by mouth 2 (two) times daily as needed for anxiety.   Yes Historical Provider, MD  losartan (COZAAR) 100 MG tablet Take 100 mg by mouth daily.   Yes Historical Provider, MD  meclizine (ANTIVERT) 25 MG tablet Take 25 mg by mouth 3 (three) times daily as needed for dizziness.    Yes Historical Provider, MD  montelukast (SINGULAIR) 10 MG tablet Take 10 mg by mouth at bedtime.   Yes Historical Provider, MD  Multiple Vitamins-Minerals (MULTIVITAMIN WITH MINERALS) tablet Take 1 tablet by mouth daily.   Yes Historical Provider, MD  oseltamivir (TAMIFLU) 75 MG capsule Take 1 capsule (75 mg total) by mouth 2 (two) times daily. 08/28/15  Yes Smiley Houseman, MD  pravastatin (PRAVACHOL) 20 MG tablet Take 20 mg by mouth every evening.    Yes Historical Provider, MD  sulfamethoxazole-trimethoprim (BACTRIM DS,SEPTRA DS) 800-160 MG tablet Take 1 tablet by mouth 3 (three) times a week.   Yes Historical Provider, MD  traMADol (ULTRAM) 50 MG tablet Take 50 mg by mouth every 6 (six) hours.   Yes Historical Provider, MD   BP 127/58 mmHg  Pulse 98  Temp(Src) 98 F (36.7 C) (Rectal)  Resp 29  Ht 5\' 7"  (1.702 m)  Wt 220 lb (99.791 kg)  BMI 34.45 kg/m2  SpO2 95% Physical Exam  Constitutional: She appears well-developed and well-nourished. No distress.  Eyes open. Apparently alert. Nonverbal to responses. Does not appear distressed.  HENT:  Head: Normocephalic.  Eyes: Conjunctivae are normal. Pupils are equal, round, and reactive to light. No scleral icterus.  Neck: Normal range of motion. Neck supple. No thyromegaly present.  Cardiovascular: Normal rate and regular rhythm.  Exam reveals no gallop and no friction rub.   No murmur  heard. Pulmonary/Chest: Effort normal and breath sounds normal. No respiratory distress. She has no wheezes. She has no rales.  Abdominal: Soft. Bowel sounds are normal. She exhibits no distension. There is no tenderness. There is no rebound.  Musculoskeletal: Normal range of motion.  Neurological: She is alert.  Eyes open.  Spontaneously will look left and right. Will not follow fingers. Will not smile or protrude tongue. No resting facial droop, upper or lower. Not drooling.  Will not hold either arm up or either leg up to follow commands area has slow decline of the arm towards the bed, no frank drop  Skin: Skin is warm and dry. No rash noted.  Psychiatric: She has a normal  mood and affect. Her behavior is normal.    ED Course  Procedures (including critical care time) Labs Review Labs Reviewed  URINALYSIS, ROUTINE W REFLEX MICROSCOPIC (NOT AT [facility]) - Abnormal; Notable for the following:    Glucose, UA 250 (*)    Hgb urine dipstick MODERATE (*)    Ketones, ur 15 (*)    Protein, ur >300 (*)    All other components within normal limits  COMPREHENSIVE METABOLIC PANEL - Abnormal; Notable for the following:    Potassium 3.3 (*)    Chloride 93 (*)    Glucose, Bld 201 (*)    Total Protein 9.2 (*)    Albumin 3.3 (*)    AST 62 (*)    Anion gap 16 (*)    All other components within normal limits  URINE MICROSCOPIC-ADD ON - Abnormal; Notable for the following:    Squamous Epithelial / LPF 0-5 (*)    Bacteria, UA RARE (*)    Casts HYALINE CASTS (*)    All other components within normal limits  I-STAT CHEM 8, ED - Abnormal; Notable for the following:    Potassium 3.2 (*)    Chloride 91 (*)    Glucose, Bld 209 (*)    Calcium, Ion 1.00 (*)    Hemoglobin 17.0 (*)    HCT 50.0 (*)    All other components within normal limits  CBG MONITORING, ED - Abnormal; Notable for the following:    Glucose-Capillary 200 (*)    All other components within normal limits  CBG MONITORING, ED -  Abnormal; Notable for the following:    Glucose-Capillary 173 (*)    All other components within normal limits  URINE CULTURE  CBC WITH DIFFERENTIAL/PLATELET  TROPONIN I  CBC WITH DIFFERENTIAL/PLATELET  PROTIME-INR  APTT  I-STAT CG4 LACTIC ACID, ED    Imaging Review Ct Angio Head W/cm &/or Wo Cm  08/29/2015  CLINICAL DATA:  82 year old female code stroke patient with acute intracranial hemorrhage and ventriculomegaly. Possible aneurysm rupture. Initial encounter. EXAM: CT ANGIOGRAPHY HEAD TECHNIQUE: Multidetector CT imaging of the head was performed using the standard protocol during bolus administration of intravenous contrast. Multiplanar CT image reconstructions and MIPs were obtained to evaluate the vascular anatomy. CONTRAST:  74mL OMNIPAQUE IOHEXOL 350 MG/ML SOLN COMPARISON:  Head CT without contrast 1101 hours today. FINDINGS: Posterior circulation: Mildly dominant right vertebral artery in the visualized upper neck and at the skullbase. Tortuous but otherwise negative distal cervical vertebral arteries. Normal PICA origins. Normal vertebrobasilar junction. The basilar artery is patent without stenosis. There is mild ectasia of the basilar artery, but SCA and PCA origins are normal. Posterior communicating arteries are diminutive. Bilateral PCA branches are normal. Anterior circulation: Tortuous distal cervical ICAs. Mildly beaded appearance of the distal left cervical ICA (series 20, image 28). Similar beaded appearance on the right most notable on series 19, image 21. Both ICA siphons are patent. Bilateral calcified ICA siphon plaque. No siphon stenosis. Ophthalmic artery origins are within normal limits. There are small infundibula at both posterior communicating artery origins which otherwise are normal. Mildly ectatic carotid termini. Normal MCA and ACA origins. Mild ectasia of the anterior communicating artery without saccular aneurysm. Tortuous but otherwise normal bilateral ACA branches.  Left MCA M1 segment, left MCA bifurcation, and left MCA branches are within normal limits. Right MCA M1 segment, bifurcation, and right MCA branches are within normal limits. Venous sinuses: Patent. Anatomic variants: None. Delayed phase:Volume of intracranial hemorrhage appears stable from 1101  hours today. Stable ventriculomegaly. No abnormal enhancement identified. IMPRESSION: 1. Negative for intracranial aneurysm or emergent large vessel occlusion. There is generalized intracranial dolichoectasia and also evidence of mild bilateral cervical ICA fibromuscular dysplasia. 2. Stable Intracranial Hemorrhage and Ventriculomegaly since 1101 hours today. 3. Calcified ICA siphon atherosclerosis, but no intracranial stenosis. Electronically Signed   By: Genevie Ann M.D.   On: 08/29/2015 14:29   Ct Head Wo Contrast  08/29/2015  CLINICAL DATA:  82 year old female with left side weakness. Altered mental status. Code stroke. Initial encounter. EXAM: CT HEAD WITHOUT CONTRAST TECHNIQUE: Contiguous axial images were obtained from the base of the skull through the vertex without intravenous contrast. COMPARISON:  08/26/2015. FINDINGS: Increased fluid levels in the left maxillary and sphenoid sinuses. Continued opacification and bubbly opacity in the ethmoid and left frontal sinuses. Tympanic cavities and mastoids are clear. No acute osseous abnormality identified. Bilateral dural calcifications re- demonstrated. Visualized orbits and scalp soft tissues are within normal limits. Mixed density intraventricular hemorrhage with ventriculomegaly is new from the recent comparison. Moderate volume of intraventricular hemorrhage, including in the fourth ventricle. Furthermore, furthermore there is basilar cistern subarachnoid hemorrhage, including in the suprasellar cistern. Posterior fossa basilar hemorrhage is majority to the left of midline, extending to midline. This is present at the left cisterna magna at near the left foramen of  L[place]ch[place]. No superimposed intra-axial hemorrhage is identified. Preexisting confluent cerebral white matter hypodensity has not significantly changed, but there is suggestion of new indistinct cortical hypodensity in both superior hemispheres (series 21, image 25). No subdural or epidural blood identified. Trace rightward midline shift. No herniation at this time. IMPRESSION: 1. Acute intracranial hemorrhage. Basilar cistern subarachnoid hemorrhage and moderate volume of intraventricular hemorrhage. No intra-axial bleed identified. This hemorrhage pattern is indeterminate but highly suspicious for aneurysmal rupture. Recommend CTA Head with contrast to evaluate further. 2. Acute ventriculomegaly.   Recommend Neurosurgical consultation. 3. Possible superior cortical edema both hemispheres. Brain MRI would evaluate this finding further. 4. Superimposed progressed changes of acute paranasal sinusitis since 08/26/2015. Critical Value/emergent results were called by telephone at the time of interpretation on 08/29/2015 at 11:21 am to Dr. Tanna Furry , who verbally acknowledged these results. Electronically Signed   By: Genevie Ann M.D.   On: 08/29/2015 11:23   Dg Chest Port 1 View  08/29/2015  CLINICAL DATA:  82 year old female with weakness, bradycardia. Initial encounter. EXAM: PORTABLE CHEST 1 VIEW COMPARISON:  08/26/2015 and earlier. FINDINGS: Portable AP semi upright view at 1003 hours. Lower lung volumes. Allowing for portable technique, the lungs are clear. Stable cardiac size and mediastinal contours. Stable visualized osseous structures. Calcified aortic atherosclerosis. IMPRESSION: Lower lung volumes, otherwise no acute cardiopulmonary abnormality. Electronically Signed   By: Genevie Ann M.D.   On: 08/29/2015 10:12   I have personally reviewed and evaluated these images and lab results as part of my medical decision-making.   EKG Interpretation None      MDM   Final diagnoses:  Subarachnoid hemorrhage (HCC)     Abnormal exam and no lateralizing findings to suggest large arterial distribution CVA. Reevaluated with labs, urine, x-ray, EKG CT. Will likely require admission. Symptoms do not sound like seizure.  While here, patient does have episodes of bradycardia down to the rate of 30. Is still sinus. These recover spontaneously.  Patient is on Coreg. No additional AV blockingmedications.Initial CT scan shows basilar subarachnoid hemorrhage without intraparenchymal hemorrhage. Has ventriculomegaly and intraventricular hemorrhage. I discussed the case with Dr. Cyndy Freeze of  neurosurgery. He requests CT angiogram this is been obtained. I discussed case with critical care. Recheck blood pressure 0000000 systolic. Started on nicardipine drip. Fails swallow study. However does protect airway. Ventilating in respiratory eating without difficulty. We will be seen by neurosurgery following CT angiogram. Will require serial admission secondary to nicardipine infusion/titration for blood pressure, vasospasm.  CRITICAL CARE Performed by: Tanna Furry JOSEPH   Total critical care time: 60 minutes  Critical care time was exclusive of separately billable procedures and treating other patients.  Critical care was necessary to treat or prevent imminent or life-threatening deterioration.  Critical care was time spent personally by me on the following activities: development of treatment plan with patient and/or surrogate as well as nursing, discussions with consultants, evaluation of patient's response to treatment, examination of patient, obtaining history from patient or surrogate, ordering and performing treatments and interventions, ordering and review of laboratory studies, ordering and review of radiographic studies, pulse oximetry and re-evaluation of patient's condition.   14:00:      Tanna Furry, MD 08/29/15 [phone]

## 2015-08-29 NOTE — ED Notes (Signed)
Called lab to add on PT INR. Lab will complete

## 2015-08-30 ENCOUNTER — Inpatient Hospital Stay (HOSPITAL_COMMUNITY): Payer: Commercial Managed Care - HMO

## 2015-08-30 DIAGNOSIS — I629 Nontraumatic intracranial hemorrhage, unspecified: Secondary | ICD-10-CM

## 2015-08-30 DIAGNOSIS — G9340 Encephalopathy, unspecified: Secondary | ICD-10-CM

## 2015-08-30 DIAGNOSIS — Z515 Encounter for palliative care: Secondary | ICD-10-CM | POA: Insufficient documentation

## 2015-08-30 DIAGNOSIS — J9601 Acute respiratory failure with hypoxia: Secondary | ICD-10-CM

## 2015-08-30 LAB — URINALYSIS, ROUTINE W REFLEX MICROSCOPIC
BILIRUBIN URINE: NEGATIVE
Glucose, UA: 100 mg/dL — AB
KETONES UR: 15 mg/dL — AB
LEUKOCYTES UA: NEGATIVE
NITRITE: NEGATIVE
PH: 7.5 (ref 5.0–8.0)
Specific Gravity, Urine: 1.04 — ABNORMAL HIGH (ref 1.005–1.030)

## 2015-08-30 LAB — BASIC METABOLIC PANEL
Anion gap: 11 (ref 5–15)
BUN: 17 mg/dL (ref 6–20)
CHLORIDE: 96 mmol/L — AB (ref 101–111)
CO2: 30 mmol/L (ref 22–32)
CREATININE: 1.32 mg/dL — AB (ref 0.44–1.00)
Calcium: 8.9 mg/dL (ref 8.9–10.3)
GFR calc Af Amer: 43 mL/min — ABNORMAL LOW (ref >60)
GFR calc non Af Amer: 37 mL/min — ABNORMAL LOW (ref >60)
GLUCOSE: 170 mg/dL — AB (ref 65–99)
POTASSIUM: 3.2 mmol/L — AB (ref 3.5–5.1)
Sodium: 137 mmol/L (ref 135–145)

## 2015-08-30 LAB — CBC
HEMATOCRIT: 35.1 % — AB (ref 36.0–46.0)
Hemoglobin: 11.8 g/dL — ABNORMAL LOW (ref 12.0–15.0)
MCH: 31.5 pg (ref 26.0–34.0)
MCHC: 33.6 g/dL (ref 30.0–36.0)
MCV: 93.6 fL (ref 78.0–100.0)
PLATELETS: 191 10*3/uL (ref 150–400)
RBC: 3.75 MIL/uL — ABNORMAL LOW (ref 3.87–5.11)
RDW: 13.6 % (ref 11.5–15.5)
WBC: 8.4 10*3/uL (ref 4.0–10.5)

## 2015-08-30 LAB — URINE MICROSCOPIC-ADD ON

## 2015-08-30 LAB — URINE CULTURE: Culture: NO GROWTH

## 2015-08-30 LAB — MAGNESIUM: Magnesium: 1.4 mg/dL — ABNORMAL LOW (ref 1.7–2.4)

## 2015-08-30 LAB — TROPONIN I: TROPONIN I: 0.04 ng/mL — AB (ref <0.031)

## 2015-08-30 LAB — PHOSPHORUS: Phosphorus: 4.3 mg/dL (ref 2.5–4.6)

## 2015-08-30 MED ORDER — MAGNESIUM SULFATE 4 GM/100ML IV SOLN
4.0000 g | Freq: Once | INTRAVENOUS | Status: AC
Start: 2015-08-30 — End: 2015-08-30
  Administered 2015-08-30: 4 g via INTRAVENOUS
  Filled 2015-08-30: qty 100

## 2015-08-30 MED ORDER — SODIUM CHLORIDE 0.9 % IV SOLN
INTRAVENOUS | Status: AC
  Administered 2015-08-30: 1000 mL via INTRAVENOUS

## 2015-08-30 MED ORDER — MAGNESIUM SULFATE 2 GM/50ML IV SOLN
2.0000 g | Freq: Once | INTRAVENOUS | Status: AC
  Administered 2015-08-30: 2 g via INTRAVENOUS
  Filled 2015-08-30: qty 50

## 2015-08-30 MED ORDER — VITAL HIGH PROTEIN PO LIQD: 1000.0000 mL | ORAL | Status: DC

## 2015-08-30 MED ORDER — OSELTAMIVIR PHOSPHATE 6 MG/ML PO SUSR
30.0000 mg | Freq: Two times a day (BID) | ORAL | Status: DC
  Administered 2015-08-30 – 2015-09-02 (×7): 30 mg via ORAL
  Filled 2015-08-30 (×10): qty 5

## 2015-08-30 MED ORDER — PRO-STAT SUGAR FREE PO LIQD
30.0000 mL | Freq: Two times a day (BID) | ORAL | Status: DC
  Administered 2015-08-30 – 2015-09-04 (×11): 30 mL
  Filled 2015-08-30 (×11): qty 30

## 2015-08-30 MED ORDER — POTASSIUM CHLORIDE 10 MEQ/100ML IV SOLN
10.0000 meq | INTRAVENOUS | Status: AC
  Administered 2015-08-30 (×4): 10 meq via INTRAVENOUS
  Filled 2015-08-30 (×4): qty 100

## 2015-08-30 MED ORDER — VITAL 1.5 CAL PO LIQD
1000.0000 mL | ORAL | Status: DC
  Administered 2015-08-30 – 2015-09-03 (×5): 1000 mL
  Filled 2015-08-30 (×7): qty 1000

## 2015-08-30 MED ORDER — POTASSIUM CHLORIDE 20 MEQ/15ML (10%) PO SOLN
40.0000 meq | Freq: Three times a day (TID) | ORAL | Status: AC
  Administered 2015-08-30: 40 meq
  Filled 2015-08-30: qty 30

## 2015-08-30 NOTE — Progress Notes (Signed)
Attempted to reach pt's son, Arelys Arabie at [phone], to arrange a goals of care meeting. Left message. Will continue and try to reach him over the weekend. Thank you, Romona Curls, ANP-ACHPN

## 2015-08-30 NOTE — Progress Notes (Signed)
PULMONARY / CRITICAL CARE MEDICINE   Name: Ann Beasley MRN: [id number] DOB: 05-09-1932    ADMISSION DATE:  08/29/2015 CONSULTATION DATE:  08/29/15  REFERRING MD:  ED  CHIEF COMPLAINT: Altered mental status, concern for CVA  HISTORY OF PRESENT ILLNESS:   Ann Beasley is a 82 year old with past mental history of hypertension, asthma, sleep apnea, kidney stones, CLL. She is admitted from 08/26/15 to 08/28/15 with syncope, altered mental status. Found to have a new diagnosis of Flu infection moderate AS. She returns in one day with altered mental status. Found to have a large SAH. Neurosurgery consulted. Started on Nicardipine drip for HTN (BP 210/105).  SUBJECTIVE:  3/31- Admit with SAH.  VITAL SIGNS: BP 126/61 mmHg  Pulse 96  Temp(Src) 98.5 F (36.9 C) (Axillary)  Resp 34  Ht 5\' 7"  (1.702 m)  Wt 99.791 kg (220 lb)  BMI 34.45 kg/m2  SpO2 100%  HEMODYNAMICS:    VENTILATOR SETTINGS:    INTAKE / OUTPUT: I/O last 3 completed shifts: In: 953.6 [I.V.:603.6; IV Piggyback:350] Out: 458 [Urine:386; Drains:72]  PHYSICAL EXAMINATION: General:  Unresponsive, no distress, RR 39 Neuro:  Equal, reactive pupils. HEENT:  No thyromegaly, JVD Cardiovascular:  Regular rate and rhythm, no murmurs rubs gallops Lungs:  Clear, no wheeze, crackles Abdomen:  Soft, positive bowel sounds Musculoskeletal: Normal tone and bulk Skin:  Intact  LABS:  BMET  Recent Labs Lab 08/28/15 0506 08/29/15 1010 08/29/15 1044 08/30/15 0249  NA 141 137 136 137  K 3.5 3.2* 3.3* 3.2*  CL 106 91* 93* 96*  CO2 30  --  27 30  BUN 5* 9 7 17   CREATININE 0.65 0.60 0.68 1.32*  GLUCOSE 100* 209* 201* 170*    Electrolytes  Recent Labs Lab 08/28/15 0506 08/29/15 1044 08/30/15 0249  CALCIUM 8.3* 9.3 8.9  MG  --   --  1.4*  PHOS  --   --  4.3    CBC  Recent Labs Lab 08/28/15 0506 08/29/15 1010 08/29/15 1044 08/30/15 0249  WBC 3.0*  --  6.7 8.4  HGB 9.8* 17.0* 14.1 11.8*  HCT 29.3* 50.0*  40.5 35.1*  PLT 112*  --  174 191    Coag's  Recent Labs Lab 08/29/15 1630  APTT 32  INR 1.03    Sepsis Markers  Recent Labs Lab 08/26/15 1037 08/29/15 1010 08/29/15 1630  LATICACIDVEN 1.62 1.98 1.4  PROCALCITON  --   --  <0.10    ABG  Recent Labs Lab 08/29/15 1622  PHART 7.512*  PCO2ART 37.5  PO2ART 90.0    Liver Enzymes  Recent Labs Lab 08/27/15 0451 08/28/15 0506 08/29/15 1044  AST 82* 64* 62*  ALT 30 26 31   ALKPHOS 61 60 86  BILITOT 0.5 0.5 0.8  ALBUMIN 2.6* 2.4* 3.3*    Cardiac Enzymes  Recent Labs Lab 08/29/15 1630 08/29/15 2217 08/30/15 0249  TROPONINI 0.03 0.05* 0.04*    Glucose  Recent Labs Lab 08/29/15 0947 08/29/15 1210  GLUCAP 200* 173*    Imaging Ct Angio Head W/cm &/or Wo Cm  08/29/2015  CLINICAL DATA:  82 year old female code stroke patient with acute intracranial hemorrhage and ventriculomegaly. Possible aneurysm rupture. Initial encounter. EXAM: CT ANGIOGRAPHY HEAD TECHNIQUE: Multidetector CT imaging of the head was performed using the standard protocol during bolus administration of intravenous contrast. Multiplanar CT image reconstructions and MIPs were obtained to evaluate the vascular anatomy. CONTRAST:  77mL OMNIPAQUE IOHEXOL 350 MG/ML SOLN COMPARISON:  Head CT without contrast 1101  hours today. FINDINGS: Posterior circulation: Mildly dominant right vertebral artery in the visualized upper neck and at the skullbase. Tortuous but otherwise negative distal cervical vertebral arteries. Normal PICA origins. Normal vertebrobasilar junction. The basilar artery is patent without stenosis. There is mild ectasia of the basilar artery, but SCA and PCA origins are normal. Posterior communicating arteries are diminutive. Bilateral PCA branches are normal. Anterior circulation: Tortuous distal cervical ICAs. Mildly beaded appearance of the distal left cervical ICA (series 20, image 28). Similar beaded appearance on the right most notable  on series 19, image 21. Both ICA siphons are patent. Bilateral calcified ICA siphon plaque. No siphon stenosis. Ophthalmic artery origins are within normal limits. There are small infundibula at both posterior communicating artery origins which otherwise are normal. Mildly ectatic carotid termini. Normal MCA and ACA origins. Mild ectasia of the anterior communicating artery without saccular aneurysm. Tortuous but otherwise normal bilateral ACA branches. Left MCA M1 segment, left MCA bifurcation, and left MCA branches are within normal limits. Right MCA M1 segment, bifurcation, and right MCA branches are within normal limits. Venous sinuses: Patent. Anatomic variants: None. Delayed phase:Volume of intracranial hemorrhage appears stable from 1101 hours today. Stable ventriculomegaly. No abnormal enhancement identified. IMPRESSION: 1. Negative for intracranial aneurysm or emergent large vessel occlusion. There is generalized intracranial dolichoectasia and also evidence of mild bilateral cervical ICA fibromuscular dysplasia. 2. Stable Intracranial Hemorrhage and Ventriculomegaly since 1101 hours today. 3. Calcified ICA siphon atherosclerosis, but no intracranial stenosis. Electronically Signed   By: Genevie Ann M.D.   On: 08/29/2015 14:29   Ct Head Wo Contrast  08/29/2015  CLINICAL DATA:  82 year old female with left side weakness. Altered mental status. Code stroke. Initial encounter. EXAM: CT HEAD WITHOUT CONTRAST TECHNIQUE: Contiguous axial images were obtained from the base of the skull through the vertex without intravenous contrast. COMPARISON:  08/26/2015. FINDINGS: Increased fluid levels in the left maxillary and sphenoid sinuses. Continued opacification and bubbly opacity in the ethmoid and left frontal sinuses. Tympanic cavities and mastoids are clear. No acute osseous abnormality identified. Bilateral dural calcifications re- demonstrated. Visualized orbits and scalp soft tissues are within normal limits.  Mixed density intraventricular hemorrhage with ventriculomegaly is new from the recent comparison. Moderate volume of intraventricular hemorrhage, including in the fourth ventricle. Furthermore, furthermore there is basilar cistern subarachnoid hemorrhage, including in the suprasellar cistern. Posterior fossa basilar hemorrhage is majority to the left of midline, extending to midline. This is present at the left cisterna magna at near the left foramen of Luschka. No superimposed intra-axial hemorrhage is identified. Preexisting confluent cerebral white matter hypodensity has not significantly changed, but there is suggestion of new indistinct cortical hypodensity in both superior hemispheres (series 21, image 25). No subdural or epidural blood identified. Trace rightward midline shift. No herniation at this time. IMPRESSION: 1. Acute intracranial hemorrhage. Basilar cistern subarachnoid hemorrhage and moderate volume of intraventricular hemorrhage. No intra-axial bleed identified. This hemorrhage pattern is indeterminate but highly suspicious for aneurysmal rupture. Recommend CTA Head with contrast to evaluate further. 2. Acute ventriculomegaly.   Recommend Neurosurgical consultation. 3. Possible superior cortical edema both hemispheres. Brain MRI would evaluate this finding further. 4. Superimposed progressed changes of acute paranasal sinusitis since 08/26/2015. Critical Value/emergent results were called by telephone at the time of interpretation on 08/29/2015 at 11:21 am to Dr. Tanna Furry , who verbally acknowledged these results. Electronically Signed   By: Genevie Ann M.D.   On: 08/29/2015 11:23   Dg Chest [facility]  08/29/2015  CLINICAL DATA:  82 year old female with weakness, bradycardia. Initial encounter. EXAM: PORTABLE CHEST 1 VIEW COMPARISON:  08/26/2015 and earlier. FINDINGS: Portable AP semi upright view at 1003 hours. Lower lung volumes. Allowing for portable technique, the lungs are clear. Stable  cardiac size and mediastinal contours. Stable visualized osseous structures. Calcified aortic atherosclerosis. IMPRESSION: Lower lung volumes, otherwise no acute cardiopulmonary abnormality. Electronically Signed   By: Genevie Ann M.D.   On: 08/29/2015 10:12     STUDIES:  CT head 3/31 > acute intracranial hemorrhage with subarachnoid and intraventricular blood. Acute ventriculomegaly. CTA head 3/31 > negative for aneurysm  CULTURES:   ANTIBIOTICS: Tamiflu   SIGNIFICANT EVENTS:   LINES/TUBES:   DISCUSSION: 82 year old with complicated past medical history. Recent admission with syncope, flu a infection. She is now readmitted 1 day after discharge with acute altered mental status. Found to have large subarachnoid, intraventricular hemorrhage with ventriculomegaly. No evidence of arachnoid rupture on CTA  ASSESSMENT / PLAN:  PULMONARY A: Flu a infection. Asthma Sleep apnea P:   Monitor for evidence of respiratory failure, RR in the high thirties. Continue Tamiflu for at least 2 more days. Supplemental oxygen. Nebulizer when necessary. Start BiPAP with increase WOB.  CARDIOVASCULAR A:  Hypertensive emergency P:  Nicardipine drip as needed. Goal SBP 100-120.  RENAL A:   Stable P:   Monitor urine output and creatinine Replace electrolytes as indicated. IVF. TF per nutrition.  GASTROINTESTINAL A:   Stable P:   TF per nutrition. PPI. Insert NGT.  HEMATOLOGIC A:   Stable P:  Monitor.  INFECTIOUS A:   Recent flu a infection P:   Continue Tamiflu.  ENDOCRINE A:   No issues P:   SSI  NEUROLOGIC A:   Altered mental status. Subarachnoid, intraventricular hemorrhage P:   And neurosurgery following. Make get a drain BP control with nicardipine.  FAMILY  - Updates: Family updated at bedside. Extensive discussion about goals of care were made. They are okay with temporary drain. They however realistic chances of recovery. They want to give his a day  or 2. They however did not want any life sustaining measures or a permanent drain. Her code status has been changed to DNR/DNI. If her condition were to deteriorate then they are ok with comfort measures.   - Inter-disciplinary family meet or Palliative Care meeting due by:  4/7.  The patient is critically ill with multiple organ systems failure and requires high complexity decision making for assessment and support, frequent evaluation and titration of therapies, application of advanced monitoring technologies and extensive interpretation of multiple databases.   Critical Care Time devoted to patient care services described in this note is  35  Minutes. This time reflects time of care of this signee Dr Jennet Maduro. This critical care time does not reflect procedure time, or teaching time or supervisory time of PA/NP/Med student/Med Resident etc but could involve care discussion time.  Rush Farmer, M.D. [facility] Pulmonary/Critical Care Medicine. Pager: [phone]. After hours pager: [phone].  08/30/2015, 9:34 AM

## 2015-08-30 NOTE — Progress Notes (Addendum)
Initial Nutrition Assessment  DOCUMENTATION CODES:  Not applicable  INTERVENTION:  Initiate Vital 1.5 @ 20 ml/hr via NGT and increase by 10 ml every 4 hours to goal rate of 45 ml/hr.   30 ml Prostat BID   Tube feeding regimen provides 1820 kcal (100% of needs), 103 grams of protein, and 825 ml of H2O.   NUTRITION DIAGNOSIS:  Inadequate oral intake related to inability to eat as evidenced by NPO status.  GOAL:  Patient will meet greater than or equal to 90% of their needs  MONITOR:  TF tolerance, Labs, Weight trends, I & O's, Diet advancement  REASON FOR ASSESSMENT:  Consult Enteral/tube feeding initiation and management  ASSESSMENT:  82 y/o female PMHx HTN, CLL, who was briefly admitted 3/28-3/30 with syncope and AMS, found influenza and aortic stenosis.   Readmitted one day later with large SAH w/ hydrocephalus. S/P placement of EVD  Pt seen by RD during her prior admission on 3/30. Per that report, pt had poor appetite PTA. She ate well that admission though. She endorsed a gradual diminished appetite.   Today family reports that the couple days pt was home she was eating well until this acute episode. She did not take any vitamins/minerals. She had been suffering from diarrhea recently.   Cortrak placed today.   Pts weight of 220 lbs due to faulty bed weight, Last bed weight: 147.6 lbs. Unsure if this is fault as well. Will use wt from 3/30 Assessment (74.662 kg)  which was taken during her brief admission from 3/28-3/30.  Diet Order:  Diet NPO time specified  Skin:  Abrasion to R foot, Surgical incision to R head, EVD   Last BM:  Unknown  Height:  Ht Readings from Last 1 Encounters:  08/29/15 5\' 7"  (1.702 m)  Height last admission 5\' 5"    Weight:  Wt Readings from Last 1 Encounters:  08/29/15 220 lb (99.791 kg)   Wt Readings from Last 10 Encounters:  08/29/15 220 lb (99.791 kg)  08/26/15 164 lb 9.6 oz (74.662 kg)  11/06/14 188 lb 9.6 oz (85.548 kg)  05/03/14  192 lb 9.6 oz (87.363 kg)  01/01/14 197 lb (89.359 kg)  07/31/13 186 lb 9.6 oz (84.641 kg)  06/05/13 181 lb (82.101 kg)  04/06/13 182 lb (82.555 kg)  03/16/13 184 lb (83.462 kg)  02/28/13 184 lb (83.462 kg)   Ideal Body Weight:  56.82-61.36  BMI:  Body mass index using dosing weight is 25.8 kg/(m^2).  Estimated Nutritional Needs:  Kcal:  1650-1850 (22-25 kcal/kg bw) Protein:  90-102 g (1.5-1.7 g/kg IBW) Fluid:  1.8 liters  EDUCATION NEEDS:  No education needs identified at this time   Burtis Junes RD, LDN Clinical Nutrition Pager: [phone] 08/30/2015 12:53 PM

## 2015-08-30 NOTE — Progress Notes (Signed)
eLink Physician-Brief Progress Note Patient Name: SHITAL DEWAARD DOB: 03/10/1932 MRN: [id number]   Date of Service  08/30/2015  HPI/Events of Note  Hypokalemia and hypomag  eICU Interventions  Potassium and Mag replaced     Intervention Category Intermediate Interventions: Electrolyte abnormality - evaluation and management  Sintia Mckissic 08/30/2015, 4:28 AM

## 2015-08-30 NOTE — Progress Notes (Signed)
No acute events AVSS EVD draining Arouses to physical stimulus Aphasic PERRL Localizes, does not follow commands Slight improvement Prognosis remains poor Continue CSF diversion for several days

## 2015-08-31 DIAGNOSIS — N179 Acute kidney failure, unspecified: Secondary | ICD-10-CM

## 2015-08-31 LAB — BASIC METABOLIC PANEL
ANION GAP: 11 (ref 5–15)
BUN: 33 mg/dL — ABNORMAL HIGH (ref 6–20)
CALCIUM: 8.5 mg/dL — AB (ref 8.9–10.3)
CHLORIDE: 103 mmol/L (ref 101–111)
CO2: 28 mmol/L (ref 22–32)
CREATININE: 0.87 mg/dL (ref 0.44–1.00)
GLUCOSE: 125 mg/dL — AB (ref 65–99)
Potassium: 3.3 mmol/L — ABNORMAL LOW (ref 3.5–5.1)
Sodium: 142 mmol/L (ref 135–145)

## 2015-08-31 LAB — CBC
HEMATOCRIT: 34 % — AB (ref 36.0–46.0)
HEMOGLOBIN: 10.9 g/dL — AB (ref 12.0–15.0)
MCH: 30.9 pg (ref 26.0–34.0)
MCHC: 32.1 g/dL (ref 30.0–36.0)
MCV: 96.3 fL (ref 78.0–100.0)
Platelets: 178 10*3/uL (ref 150–400)
RBC: 3.53 MIL/uL — ABNORMAL LOW (ref 3.87–5.11)
RDW: 14.1 % (ref 11.5–15.5)
WBC: 4.7 10*3/uL (ref 4.0–10.5)

## 2015-08-31 LAB — MAGNESIUM: MAGNESIUM: 3 mg/dL — AB (ref 1.7–2.4)

## 2015-08-31 LAB — GLUCOSE, CAPILLARY
GLUCOSE-CAPILLARY: 114 mg/dL — AB (ref 65–99)
GLUCOSE-CAPILLARY: 111 mg/dL — AB (ref 65–99)
Glucose-Capillary: 118 mg/dL — ABNORMAL HIGH (ref 65–99)

## 2015-08-31 LAB — PHOSPHORUS: PHOSPHORUS: 3.1 mg/dL (ref 2.5–4.6)

## 2015-08-31 MED ORDER — LABETALOL HCL 5 MG/ML IV SOLN
10.0000 mg | INTRAVENOUS | Status: DC | PRN
  Administered 2015-08-31 – 2015-09-02 (×14): 10 mg via INTRAVENOUS
  Filled 2015-08-31 (×13): qty 4

## 2015-08-31 MED ORDER — SODIUM CHLORIDE 0.9 % IV SOLN
INTRAVENOUS | Status: DC
  Administered 2015-08-31: 1000 mL via INTRAVENOUS
  Administered 2015-08-31: 12:00:00 via INTRAVENOUS
  Administered 2015-09-01: 1000 mL via INTRAVENOUS

## 2015-08-31 MED ORDER — CETYLPYRIDINIUM CHLORIDE 0.05 % MT LIQD
7.0000 mL | Freq: Two times a day (BID) | OROMUCOSAL | Status: DC
  Administered 2015-08-31 – 2015-09-04 (×10): 7 mL via OROMUCOSAL

## 2015-08-31 MED ORDER — CHLORHEXIDINE GLUCONATE 0.12 % MT SOLN
15.0000 mL | Freq: Two times a day (BID) | OROMUCOSAL | Status: DC
  Administered 2015-08-31 – 2015-09-04 (×10): 15 mL via OROMUCOSAL
  Filled 2015-08-31 (×5): qty 15

## 2015-08-31 NOTE — Progress Notes (Signed)
PULMONARY / CRITICAL CARE MEDICINE   Name: Ann Beasley MRN: [id number] DOB: [birth year]    ADMISSION DATE:  08/29/2015 CONSULTATION DATE:  08/29/15  REFERRING MD:  ED  CHIEF COMPLAINT: Altered mental status, concern for CVA  HISTORY OF PRESENT ILLNESS:   Ann Beasley is a 82 year old with past mental history of hypertension, asthma, sleep apnea, kidney stones, CLL. She is admitted from 08/26/15 to 08/28/15 with syncope, altered mental status. Found to have a new diagnosis of Flu infection moderate AS. She returns in one day with altered mental status. Found to have a large SAH. Neurosurgery consulted. Started on Nicardipine drip for HTN (BP 210/105).  SUBJECTIVE:  Low UOP overnight, responded to IVF, remains without response.  VITAL SIGNS: BP 148/70 mmHg  Pulse 79  Temp(Src) 98.5 F (36.9 C) (Oral)  Resp 26  Ht 5\' 7"  (1.702 m)  Wt 99.791 kg (220 lb)  BMI 34.45 kg/m2  SpO2 100%  HEMODYNAMICS:    VENTILATOR SETTINGS:    INTAKE / OUTPUT: I/O last 3 completed shifts: In: 2660.3 [I.V.:1405.3; NG/GT:605; IV Piggyback:650] Out: 878 [Urine:682; Drains:196]  PHYSICAL EXAMINATION: General:  Unresponsive, no distress, RR 39 Neuro:  Equal, reactive pupils. HEENT:  No thyromegaly, JVD Cardiovascular:  Regular rate and rhythm, no murmurs rubs gallops Lungs:  Clear, no wheeze, crackles Abdomen:  Soft, positive bowel sounds Musculoskeletal: Normal tone and bulk Skin:  Intact  LABS:  BMET  Recent Labs Lab 08/29/15 1044 08/30/15 0249 08/31/15 0450  NA 136 137 142  K 3.3* 3.2* 3.3*  CL 93* 96* 103  CO2 27 30 28   BUN 7 17 33*  CREATININE 0.68 1.32* 0.87  GLUCOSE 201* 170* 125*    Electrolytes  Recent Labs Lab 08/29/15 1044 08/30/15 0249 08/31/15 0450  CALCIUM 9.3 8.9 8.5*  MG  --  1.4* 3.0*  PHOS  --  4.3 3.1    CBC  Recent Labs Lab 08/29/15 1044 08/30/15 0249 08/31/15 0450  WBC 6.7 8.4 4.7  HGB 14.1 11.8* 10.9*  HCT 40.5 35.1* 34.0*  PLT 174 191  178    Coag's  Recent Labs Lab 08/29/15 1630  APTT 32  INR 1.03    Sepsis Markers  Recent Labs Lab 08/26/15 1037 08/29/15 1010 08/29/15 1630  LATICACIDVEN 1.62 1.98 1.4  PROCALCITON  --   --  <0.10    ABG  Recent Labs Lab 08/29/15 1622  PHART 7.512*  PCO2ART 37.5  PO2ART 90.0    Liver Enzymes  Recent Labs Lab 08/27/15 0451 08/28/15 0506 08/29/15 1044  AST 82* 64* 62*  ALT 30 26 31   ALKPHOS 61 60 86  BILITOT 0.5 0.5 0.8  ALBUMIN 2.6* 2.4* 3.3*    Cardiac Enzymes  Recent Labs Lab 08/29/15 1630 08/29/15 2217 08/30/15 0249  TROPONINI 0.03 0.05* 0.04*    Glucose  Recent Labs Lab 08/29/15 0947 08/29/15 1210  GLUCAP 200* 173*    Imaging Dg Abd Portable 1v  08/30/2015  CLINICAL DATA:  Feeding tube placement. EXAM: PORTABLE ABDOMEN - 1 VIEW COMPARISON:  None. FINDINGS: A small bore feeding tube is identified with tip at the duodenal/jejunal junction. Nonspecific nonobstructive bowel gas pattern is present. IMPRESSION: Small bore feeding tube with tip at the duodenal/ jejunal junction. Electronically Signed   By: Margarette Canada M.D.   On: 08/30/2015 12:38     STUDIES:  CT head 3/31 > acute intracranial hemorrhage with subarachnoid and intraventricular blood. Acute ventriculomegaly. CTA head 3/31 > negative for aneurysm  CULTURES:  ANTIBIOTICS: Tamiflu   SIGNIFICANT EVENTS:   LINES/TUBES:   DISCUSSION: 82 year old with complicated past medical history. Recent admission with syncope, flu a infection. She is now readmitted 1 day after discharge with acute altered mental status. Found to have large subarachnoid, intraventricular hemorrhage with ventriculomegaly. No evidence of arachnoid rupture on CTA  ASSESSMENT / PLAN:  PULMONARY A: Flu a infection. Asthma Sleep apnea P:   Monitor for evidence of respiratory failure, RR in the high thirties. Continue Tamiflu for at least 2 more days. Supplemental oxygen. Nebulizer when  necessary. Start BiPAP with increase WOB.  CARDIOVASCULAR A:  Hypertensive emergency P:  Nicardipine drip as needed. Goal SBP 100-120.  RENAL A:   Stable P:   Monitor urine output and creatinine Replace electrolytes as indicated. IVF. TF per nutrition.  GASTROINTESTINAL A:   Stable P:   TF per nutrition. PPI. Insert NGT.  HEMATOLOGIC A:   Stable P:  Monitor.  INFECTIOUS A:   Recent flu a infection P:   Continue Tamiflu.  ENDOCRINE A:   No issues P:   SSI  NEUROLOGIC A:   Altered mental status. Subarachnoid, intraventricular hemorrhage P:   And neurosurgery following. Make get a drain BP control with nicardipine.  FAMILY  - Updates: Family updated at bedside. Extensive discussion about goals of care were made. They are okay with temporary drain. They however realistic chances of recovery. They want to give his a day or 2. They however did not want any life sustaining measures or a permanent drain. Her code status has been changed to DNR/DNI. If her condition were to deteriorate then they are ok with comfort measures.  Need to discuss code state.  - Inter-disciplinary family meet or Palliative Care meeting due by:  4/7.  The patient is critically ill with multiple organ systems failure and requires high complexity decision making for assessment and support, frequent evaluation and titration of therapies, application of advanced monitoring technologies and extensive interpretation of multiple databases.   Critical Care Time devoted to patient care services described in this note is  35  Minutes. This time reflects time of care of this signee Dr Jennet Maduro. This critical care time does not reflect procedure time, or teaching time or supervisory time of PA/NP/Med student/Med Resident etc but could involve care discussion time.  Rush Farmer, M.D. [facility] Pulmonary/Critical Care Medicine. Pager: [phone]. After hours pager: [phone].  08/31/2015, 9:22  AM

## 2015-08-31 NOTE — Progress Notes (Signed)
Met with patient's son Marden Noble, and patient's husband at the bedside this morning and attempted to set up an appointment for a goals of care meeting first of next week. Family reluctant to schedule an appointment but did agree that team could call on Tuesday morning to see if they were agreeable for palliative care appointment. They are still in a watchful waiting mode to see if she continues to improve and would like to have this impression before meeting with palliative medicine. Best person to contact on Tuesday would be Laurena Spies, who is patient's daughter-in-law, and her number is [phone]. Her husband is Aileen Fass patient's son. Palliative medicine team to call Tuesday morning in follow-up Thank you for the consult Romona Curls, ANP

## 2015-08-31 NOTE — Progress Notes (Signed)
No acute events Awake Following commands Remains aphasic Slight improvement Will continue EVD for one or two more days and assess response and have a family discussion

## 2015-09-01 DIAGNOSIS — Z515 Encounter for palliative care: Secondary | ICD-10-CM

## 2015-09-01 LAB — GLUCOSE, CAPILLARY
GLUCOSE-CAPILLARY: 125 mg/dL — AB (ref 65–99)
GLUCOSE-CAPILLARY: 122 mg/dL — AB (ref 65–99)
GLUCOSE-CAPILLARY: 135 mg/dL — AB (ref 65–99)
Glucose-Capillary: 118 mg/dL — ABNORMAL HIGH (ref 65–99)
Glucose-Capillary: 123 mg/dL — ABNORMAL HIGH (ref 65–99)
Glucose-Capillary: 129 mg/dL — ABNORMAL HIGH (ref 65–99)

## 2015-09-01 LAB — CBC
HCT: 28.2 % — ABNORMAL LOW (ref 36.0–46.0)
Hemoglobin: 9.3 g/dL — ABNORMAL LOW (ref 12.0–15.0)
MCH: 32.1 pg (ref 26.0–34.0)
MCHC: 33 g/dL (ref 30.0–36.0)
MCV: 97.2 fL (ref 78.0–100.0)
PLATELETS: 154 10*3/uL (ref 150–400)
RBC: 2.9 MIL/uL — AB (ref 3.87–5.11)
RDW: 13.9 % (ref 11.5–15.5)
WBC: 3.8 10*3/uL — AB (ref 4.0–10.5)

## 2015-09-01 LAB — BASIC METABOLIC PANEL
ANION GAP: 8 (ref 5–15)
BUN: 21 mg/dL — ABNORMAL HIGH (ref 6–20)
CO2: 27 mmol/L (ref 22–32)
Calcium: 8.3 mg/dL — ABNORMAL LOW (ref 8.9–10.3)
Chloride: 111 mmol/L (ref 101–111)
Creatinine, Ser: 0.61 mg/dL (ref 0.44–1.00)
Glucose, Bld: 135 mg/dL — ABNORMAL HIGH (ref 65–99)
POTASSIUM: 3.1 mmol/L — AB (ref 3.5–5.1)
SODIUM: 146 mmol/L — AB (ref 135–145)

## 2015-09-01 LAB — PHOSPHORUS: Phosphorus: 2.4 mg/dL — ABNORMAL LOW (ref 2.5–4.6)

## 2015-09-01 LAB — MAGNESIUM: Magnesium: 2 mg/dL (ref 1.7–2.4)

## 2015-09-01 MED ORDER — POTASSIUM CHLORIDE 20 MEQ/15ML (10%) PO SOLN
40.0000 meq | Freq: Three times a day (TID) | ORAL | Status: AC
  Administered 2015-09-01 (×2): 40 meq
  Filled 2015-09-01 (×2): qty 30

## 2015-09-01 MED ORDER — AMLODIPINE BESYLATE 5 MG PO TABS
5.0000 mg | ORAL_TABLET | Freq: Every day | ORAL | Status: DC
  Administered 2015-09-01 – 2015-09-02 (×2): 5 mg via ORAL
  Filled 2015-09-01 (×2): qty 1

## 2015-09-01 MED ORDER — POTASSIUM CHLORIDE 20 MEQ/15ML (10%) PO SOLN
30.0000 meq | ORAL | Status: DC
  Administered 2015-09-01: 30 meq
  Filled 2015-09-01: qty 30

## 2015-09-01 NOTE — Progress Notes (Signed)
No acute events AVSS Awake, alert Responds occasionally with single words Follows commands bilaterally Stable/improving Continue CSF diversion for now

## 2015-09-01 NOTE — Progress Notes (Signed)
[name] ADULT ICU REPLACEMENT PROTOCOL FOR AM LAB REPLACEMENT ONLY  The patient does apply for the [name] Adult ICU Electrolyte Replacment Protocol based on the criteria listed below:   1. Is GFR >/= 40 ml/min? Yes.    Patient's GFR today is >60 2. Is urine output >/= 0.5 ml/kg/hr for the last 6 hours? Yes.   Patient's UOP is 0.7 ml/kg/hr 3. Is BUN < 60 mg/dL? Yes.    Patient's BUN today is 21 4. Abnormal electrolyte(s):K 2.1 5. Ordered repletion with: per protocol 6. If a panic level lab has been reported, has the CCM MD in charge been notified? No..   Physician:    Ronda Fairly A 09/01/2015 6:33 AM

## 2015-09-01 NOTE — Progress Notes (Signed)
PULMONARY / CRITICAL CARE MEDICINE   Name: Ann Beasley MRN: [id number] DOB: Apr 06, 1932    ADMISSION DATE:  08/29/2015 CONSULTATION DATE:  08/29/15  REFERRING MD:  ED  CHIEF COMPLAINT: Altered mental status, concern for CVA  HISTORY OF PRESENT ILLNESS:   Ann Beasley is a 82 year old with past mental history of hypertension, asthma, sleep apnea, kidney stones, CLL. She is admitted from 08/26/15 to 08/28/15 with syncope, altered mental status. Found to have a new diagnosis of Flu infection moderate AS. She returns in one day with altered mental status. Found to have a large SAH. Neurosurgery consulted. Started on Nicardipine drip for HTN (BP 210/105).  SUBJECTIVE:  Low UOP overnight, responded to IVF, remains without response.  VITAL SIGNS: BP 160/62 mmHg  Pulse 64  Temp(Src) 99.3 F (37.4 C) (Oral)  Resp 33  Ht 5\' 7"  (1.702 m)  Wt 99.791 kg (220 lb)  BMI 34.45 kg/m2  SpO2 100%  HEMODYNAMICS:    VENTILATOR SETTINGS:    INTAKE / OUTPUT: I/O last 3 completed shifts: In: 5198 [I.V.:3603; AI:[id number]; IV Piggyback:50] Out: 2621 [id number]; Drains:260; Stool:600]  PHYSICAL EXAMINATION: General:  Unresponsive, no distress, RR 39 Neuro:  Equal, reactive pupils. HEENT:  No thyromegaly, JVD Cardiovascular:  Regular rate and rhythm, no murmurs rubs gallops Lungs:  Clear, no wheeze, crackles Abdomen:  Soft, positive bowel sounds Musculoskeletal: Normal tone and bulk Skin:  Intact  LABS:  BMET  Recent Labs Lab 08/30/15 0249 08/31/15 0450 09/01/15 0407  NA 137 142 146*  K 3.2* 3.3* 3.1*  CL 96* 103 111  CO2 30 28 27   BUN 17 33* 21*  CREATININE 1.32* 0.87 0.61  GLUCOSE 170* 125* 135*    Electrolytes  Recent Labs Lab 08/30/15 0249 08/31/15 0450 09/01/15 0407  CALCIUM 8.9 8.5* 8.3*  MG 1.4* 3.0* 2.0  PHOS 4.3 3.1 2.4*    CBC  Recent Labs Lab 08/30/15 0249 08/31/15 0450 09/01/15 0407  WBC 8.4 4.7 3.8*  HGB 11.8* 10.9* 9.3*  HCT 35.1* 34.0* 28.2*   PLT 191 178 154    Coag's  Recent Labs Lab 08/29/15 1630  APTT 32  INR 1.03    Sepsis Markers  Recent Labs Lab 08/26/15 1037 08/29/15 1010 08/29/15 1630  LATICACIDVEN 1.62 1.98 1.4  PROCALCITON  --   --  <0.10    ABG  Recent Labs Lab 08/29/15 1622  PHART 7.512*  PCO2ART 37.5  PO2ART 90.0    Liver Enzymes  Recent Labs Lab 08/27/15 0451 08/28/15 0506 08/29/15 1044  AST 82* 64* 62*  ALT 30 26 31   ALKPHOS 61 60 86  BILITOT 0.5 0.5 0.8  ALBUMIN 2.6* 2.4* 3.3*    Cardiac Enzymes  Recent Labs Lab 08/29/15 1630 08/29/15 2217 08/30/15 0249  TROPONINI 0.03 0.05* 0.04*    Glucose  Recent Labs Lab 08/31/15 1140 08/31/15 1527 08/31/15 1915 08/31/15 2309 09/01/15 0309 09/01/15 0730  GLUCAP 114* 111* 118* 118* 123* 125*    Imaging No results found.  STUDIES:  CT head 3/31 > acute intracranial hemorrhage with subarachnoid and intraventricular blood. Acute ventriculomegaly. CTA head 3/31 > negative for aneurysm  CULTURES:   ANTIBIOTICS: Tamiflu   SIGNIFICANT EVENTS:   LINES/TUBES: Drain 3/30>>>  DISCUSSION: 82 year old with complicated past medical history. Recent admission with syncope, flu a infection. She is now readmitted 1 day after discharge with acute altered mental status. Found to have large subarachnoid, intraventricular hemorrhage with ventriculomegaly. No evidence of arachnoid rupture on CTA  ASSESSMENT / PLAN:  PULMONARY A: Flu a infection. Asthma Sleep apnea P:   Monitor for evidence of respiratory failure, RR in the high thirties. Continue Tamiflu for at least 1 more days. Supplemental oxygen. Nebulizer when necessary. Hold off BiPAP for now.  CARDIOVASCULAR A:  Hypertensive emergency P:  D/C Nicardipine drip. Goal SBP 100-120. Norvasc 5 mg PO daily with holding parameter.  RENAL A:   Stable P:   Monitor urine output and creatinine Replace electrolytes as indicated. KVO IVF. TF per  nutrition.  GASTROINTESTINAL A:   Stable P:   TF per nutrition. PPI. Insert NGT.  HEMATOLOGIC A:   Stable P:  Monitor.  INFECTIOUS A:   Recent flu a infection P:   Continue Tamiflu.  ENDOCRINE A:   No issues P:   SSI  NEUROLOGIC A:   Altered mental status. Subarachnoid, intraventricular hemorrhage P:   And neurosurgery following. Make get a drain. BP control with nicardipine.  FAMILY  -  No family bedside.  - Inter-disciplinary family meet or Palliative Care meeting due by:  4/7.  The patient is critically ill with multiple organ systems failure and requires high complexity decision making for assessment and support, frequent evaluation and titration of therapies, application of advanced monitoring technologies and extensive interpretation of multiple databases.   Critical Care Time devoted to patient care services described in this note is  35  Minutes. This time reflects time of care of this signee Dr Jennet Maduro. This critical care time does not reflect procedure time, or teaching time or supervisory time of PA/NP/Med student/Med Resident etc but could involve care discussion time.  Rush Farmer, M.D. [facility] Pulmonary/Critical Care Medicine. Pager: [phone]. After hours pager: [phone].  09/01/2015, 11:27 AM

## 2015-09-02 ENCOUNTER — Inpatient Hospital Stay (HOSPITAL_COMMUNITY): Payer: Commercial Managed Care - HMO

## 2015-09-02 DIAGNOSIS — I10 Essential (primary) hypertension: Secondary | ICD-10-CM

## 2015-09-02 LAB — BASIC METABOLIC PANEL
ANION GAP: 9 (ref 5–15)
BUN: 13 mg/dL (ref 6–20)
CO2: 30 mmol/L (ref 22–32)
Calcium: 9.1 mg/dL (ref 8.9–10.3)
Chloride: 105 mmol/L (ref 101–111)
Creatinine, Ser: 0.66 mg/dL (ref 0.44–1.00)
GFR calc Af Amer: >60 mL/min (ref >60)
Glucose, Bld: 147 mg/dL — ABNORMAL HIGH (ref 65–99)
POTASSIUM: 4.2 mmol/L (ref 3.5–5.1)
SODIUM: 144 mmol/L (ref 135–145)

## 2015-09-02 LAB — GLUCOSE, CAPILLARY
GLUCOSE-CAPILLARY: 130 mg/dL — AB (ref 65–99)
GLUCOSE-CAPILLARY: 133 mg/dL — AB (ref 65–99)
GLUCOSE-CAPILLARY: 140 mg/dL — AB (ref 65–99)
GLUCOSE-CAPILLARY: 141 mg/dL — AB (ref 65–99)
Glucose-Capillary: 133 mg/dL — ABNORMAL HIGH (ref 65–99)
Glucose-Capillary: 138 mg/dL — ABNORMAL HIGH (ref 65–99)
Glucose-Capillary: 146 mg/dL — ABNORMAL HIGH (ref 65–99)

## 2015-09-02 LAB — CBC
HEMATOCRIT: 31.8 % — AB (ref 36.0–46.0)
HEMOGLOBIN: 10 g/dL — AB (ref 12.0–15.0)
MCH: 30.9 pg (ref 26.0–34.0)
MCHC: 31.4 g/dL (ref 30.0–36.0)
MCV: 98.1 fL (ref 78.0–100.0)
Platelets: 188 10*3/uL (ref 150–400)
RBC: 3.24 MIL/uL — AB (ref 3.87–5.11)
RDW: 13.9 % (ref 11.5–15.5)
WBC: 4.1 10*3/uL (ref 4.0–10.5)

## 2015-09-02 LAB — MAGNESIUM: Magnesium: 2 mg/dL (ref 1.7–2.4)

## 2015-09-02 LAB — PHOSPHORUS: PHOSPHORUS: 2.8 mg/dL (ref 2.5–4.6)

## 2015-09-02 MED ORDER — AMLODIPINE BESYLATE 10 MG PO TABS
10.0000 mg | ORAL_TABLET | Freq: Every day | ORAL | Status: DC
  Administered 2015-09-03 – 2015-09-04 (×2): 10 mg via ORAL
  Filled 2015-09-02 (×2): qty 1

## 2015-09-02 NOTE — Progress Notes (Signed)
PULMONARY / CRITICAL CARE MEDICINE   Name: Ann Beasley MRN: [id number] DOB: [birth year]    ADMISSION DATE:  08/29/2015 CONSULTATION DATE:  08/29/15  REFERRING MD:  ED  CHIEF COMPLAINT: Altered mental status, concern for CVA  HISTORY OF PRESENT ILLNESS:   Mrs. Cluster is a 82 year old with past mental history of hypertension, asthma, sleep apnea, kidney stones, CLL. She is admitted from 08/26/15 to 08/28/15 with syncope, altered mental status. Found to have a new diagnosis of Flu infection moderate AS. She returns in one day with altered mental status. Found to have a large SAH. Neurosurgery consulted. Started on Nicardipine drip for HTN (BP 210/105).  SUBJECTIVE:  Low UOP overnight, responded to IVF, remains without response.  VITAL SIGNS: BP 156/75 mmHg  Pulse 91  Temp(Src) 98.3 F (36.8 C) (Oral)  Resp 28  Ht 5\' 7"  (1.702 m)  Wt 99.791 kg (220 lb)  BMI 34.45 kg/m2  SpO2 96%  HEMODYNAMICS:    VENTILATOR SETTINGS:    INTAKE / OUTPUT: I/O last 3 completed shifts: In: [id number] [I.V.:1700; NG/GT:1710; IV Piggyback:50] Out: [id number] [Urine:2350; Drains:302; Stool:1100]  PHYSICAL EXAMINATION: General:  Unresponsive, no distress, RR 39 Neuro:  Equal, reactive pupils. HEENT:  No thyromegaly, JVD Cardiovascular:  Regular rate and rhythm, no murmurs rubs gallops Lungs:  Clear, no wheeze, crackles Abdomen:  Soft, positive bowel sounds Musculoskeletal: Normal tone and bulk Skin:  Intact  LABS:  BMET  Recent Labs Lab 08/31/15 0450 09/01/15 0407 09/02/15 0302  NA 142 146* 144  K 3.3* 3.1* 4.2  CL 103 111 105  CO2 28 27 30   BUN 33* 21* 13  CREATININE 0.87 0.61 0.66  GLUCOSE 125* 135* 147*    Electrolytes  Recent Labs Lab 08/31/15 0450 09/01/15 0407 09/02/15 0302  CALCIUM 8.5* 8.3* 9.1  MG 3.0* 2.0 2.0  PHOS 3.1 2.4* 2.8    CBC  Recent Labs Lab 08/31/15 0450 09/01/15 0407 09/02/15 0302  WBC 4.7 3.8* 4.1  HGB 10.9* 9.3* 10.0*  HCT 34.0* 28.2* 31.8*   PLT 178 154 188    Coag's  Recent Labs Lab 08/29/15 1630  APTT 32  INR 1.03    Sepsis Markers  Recent Labs Lab 08/29/15 1010 08/29/15 1630  LATICACIDVEN 1.98 1.4  PROCALCITON  --  <0.10    ABG  Recent Labs Lab 08/29/15 1622  PHART 7.512*  PCO2ART 37.5  PO2ART 90.0    Liver Enzymes  Recent Labs Lab 08/27/15 0451 08/28/15 0506 08/29/15 1044  AST 82* 64* 62*  ALT 30 26 31   ALKPHOS 61 60 86  BILITOT 0.5 0.5 0.8  ALBUMIN 2.6* 2.4* 3.3*    Cardiac Enzymes  Recent Labs Lab 08/29/15 1630 08/29/15 2217 08/30/15 0249  TROPONINI 0.03 0.05* 0.04*    Glucose  Recent Labs Lab 09/01/15 1107 09/01/15 1527 09/01/15 1859 09/01/15 2351 09/02/15 0405 09/02/15 0731  GLUCAP 122* 135* 129* 141* 140* 133*    Imaging Ct Head Wo Contrast  09/02/2015  CLINICAL DATA:  Follow-up subarachnoid hemorrhage. Ventricular drain EXAM: CT HEAD WITHOUT CONTRAST TECHNIQUE: Contiguous axial images were obtained from the base of the skull through the vertex without intravenous contrast. COMPARISON:  08/29/2015 FINDINGS: Interval hole placement of right frontal ventricular drain with the drain in the right lateral ventricle near the foramina [place]. Moderate ventricular enlargement is slightly improved. Intraventricular hemorrhage also improved. Periventricular white matter hypodensity shows mild interval improvement. This is likely due to a combination of chronic microvascular ischemia as well as transependymal resorption  of CSF. Interval improvement in subarachnoid hemorrhage. No new hemorrhage compared with the recent CT Hypodensity in the brainstem is unchanged. Possible mild edema. No swelling of the brainstem. Perimesencephalic cisterns remain patent. Decreased perimesencephalic subarachnoid hemorrhage. Sinusitis with mucosal edema and air-fluid levels similar to the prior study. Feeding tube noted in the right nose. IMPRESSION: Interval placement of right frontal ventricular  drain. Moderate hydrocephalus shows mild interval improvement. Decreased intraventricular and subarachnoid hemorrhage. No new hemorrhage. Diffuse white matter low density slightly improved likely due to transependymal resorption of CSF which has improved. Hypodensity throughout the pons of questionable significance. Possible edema. Electronically Signed   By: Franchot Gallo M.D.   On: 09/02/2015 11:09    STUDIES:  CT head 3/31 > acute intracranial hemorrhage with subarachnoid and intraventricular blood. Acute ventriculomegaly. CTA head 3/31 > negative for aneurysm  CULTURES:   ANTIBIOTICS: Tamiflu   SIGNIFICANT EVENTS:   LINES/TUBES: Drain 3/30>>>  DISCUSSION: 82 year old with complicated past medical history. Recent admission with syncope, flu a infection. She is now readmitted 1 day after discharge with acute altered mental status. Found to have large subarachnoid, intraventricular hemorrhage with ventriculomegaly. No evidence of arachnoid rupture on CTA  ASSESSMENT / PLAN:  PULMONARY A: Flu a infection. Asthma Sleep apnea P:   Monitor for evidence of respiratory failure, RR in the high thirties. Continue Tamiflu will d/c in AM. Supplemental oxygen. Nebulizer when necessary. Hold off BiPAP for now.  CARDIOVASCULAR A:  Hypertensive emergency P:  D/C Nicardipine drip. Goal SBP 100-120. Increase Norvasc to 10 mg PO daily with holding parameter.  RENAL A:   Stable P:   Monitor urine output and creatinine Replace electrolytes as indicated. KVO IVF. TF per nutrition.  GASTROINTESTINAL A:   Stable P:   TF per nutrition. PPI. Insert NGT.  HEMATOLOGIC A:   Stable P:  Monitor.  INFECTIOUS A:   Recent flu a infection P:   Continue Tamiflu, d/c on 4/5.  ENDOCRINE A:   No issues P:   SSI  NEUROLOGIC A:   Altered mental status. Subarachnoid, intraventricular hemorrhage P:   And neurosurgery following. Make get a drain. BP control with  nicardipine.  FAMILY  -  No family bedside.  Palliative called family and gathering for meeting regarding plan of care.  - Inter-disciplinary family meet or Palliative Care meeting due by:  4/7.  The patient is critically ill with multiple organ systems failure and requires high complexity decision making for assessment and support, frequent evaluation and titration of therapies, application of advanced monitoring technologies and extensive interpretation of multiple databases.   Critical Care Time devoted to patient care services described in this note is  35  Minutes. This time reflects time of care of this signee Dr Jennet Maduro. This critical care time does not reflect procedure time, or teaching time or supervisory time of PA/NP/Med student/Med Resident etc but could involve care discussion time.  Rush Farmer, M.D. [facility] Pulmonary/Critical Care Medicine. Pager: [phone]. After hours pager: [phone].  09/02/2015, 11:17 AM

## 2015-09-02 NOTE — Progress Notes (Signed)
Palliative Medicine Team RN Note: Called daughter in law Syrena Briskey to set up family meeting as requested over the weekend. She will have to talk to other family members to let me know when they can come. Will f/u tomorrow if they haven't called back.  Larina Earthly, RN, BSN, [facility] 09/02/2015 9:52 AM Cell [phone] 8:00-4:00 Monday-Friday Office [phone]

## 2015-09-02 NOTE — Care Management Note (Signed)
Case Management Note  Patient Details  Name: [name] MRN: [id number] Date of Birth: April 27, 1932  Subjective/Objective:  Pt admitted on 08/29/15 with large SAH, respiratory failure.  PTA, pt resided at home with son and daughter in law.  She is active with [facility] needs.                  Action/Plan: Palliative care team to meet with family tomorrow to discuss goals of care.  Pt currently remains on ventilator with IVC drain.  Will continue to follow as pt progresses.    Expected Discharge Date:                  Expected Discharge Plan:     In-House Referral:     Discharge planning Services  CM Consult  Post Acute Care Choice:    Choice offered to:     DME Arranged:    DME Agency:     HH Arranged:    HH Agency:     Status of Service:  In process, will continue to follow  Medicare Important Message Given:    Date Medicare IM Given:    Medicare IM give by:    Date Additional Medicare IM Given:    Additional Medicare Important Message give by:     If discussed at [place] of Stay Meetings, dates discussed:    Additional Comments:  Reinaldo Raddle, RN, BSN  Trauma/Neuro ICU Case Manager [phone]

## 2015-09-02 NOTE — Progress Notes (Signed)
No acute events AVSS Awake and alert Oriented x1 Gives short responses Moves all extremities to command Drain patent Stable Continue CSF diversion Check head CT May elevate drain tomorrow

## 2015-09-02 NOTE — Progress Notes (Signed)
1330 Pt more somnolent, opens eyes to voice still but will not follow commands or speak.  Dr. Cyndy Freeze notified and ordered her EVD to be lowered to 5 cmH2O. Will continue to monitor.

## 2015-09-03 ENCOUNTER — Inpatient Hospital Stay (HOSPITAL_COMMUNITY): Payer: Commercial Managed Care - HMO

## 2015-09-03 LAB — URINALYSIS W MICROSCOPIC (NOT AT ARMC)
BILIRUBIN URINE: NEGATIVE
GLUCOSE, UA: NEGATIVE mg/dL
HGB URINE DIPSTICK: NEGATIVE
Ketones, ur: NEGATIVE mg/dL
LEUKOCYTES UA: NEGATIVE
Nitrite: NEGATIVE
PH: 7.5 (ref 5.0–8.0)
Protein, ur: 100 mg/dL — AB
SPECIFIC GRAVITY, URINE: 1.023 (ref 1.005–1.030)

## 2015-09-03 LAB — CBC
HEMATOCRIT: 33.4 % — AB (ref 36.0–46.0)
Hemoglobin: 10.7 g/dL — ABNORMAL LOW (ref 12.0–15.0)
MCH: 31.5 pg (ref 26.0–34.0)
MCHC: 32 g/dL (ref 30.0–36.0)
MCV: 98.2 fL (ref 78.0–100.0)
PLATELETS: 211 10*3/uL (ref 150–400)
RBC: 3.4 MIL/uL — ABNORMAL LOW (ref 3.87–5.11)
RDW: 13.8 % (ref 11.5–15.5)
WBC: 6 10*3/uL (ref 4.0–10.5)

## 2015-09-03 LAB — BASIC METABOLIC PANEL
ANION GAP: 11 (ref 5–15)
BUN: 24 mg/dL — ABNORMAL HIGH (ref 6–20)
CHLORIDE: 103 mmol/L (ref 101–111)
CO2: 27 mmol/L (ref 22–32)
Calcium: 9.2 mg/dL (ref 8.9–10.3)
Creatinine, Ser: 0.71 mg/dL (ref 0.44–1.00)
GFR calc non Af Amer: >60 mL/min (ref >60)
Glucose, Bld: 160 mg/dL — ABNORMAL HIGH (ref 65–99)
Potassium: 4.4 mmol/L (ref 3.5–5.1)
Sodium: 141 mmol/L (ref 135–145)

## 2015-09-03 LAB — GLUCOSE, CAPILLARY
GLUCOSE-CAPILLARY: 143 mg/dL — AB (ref 65–99)
GLUCOSE-CAPILLARY: 139 mg/dL — AB (ref 65–99)
Glucose-Capillary: 121 mg/dL — ABNORMAL HIGH (ref 65–99)
Glucose-Capillary: 130 mg/dL — ABNORMAL HIGH (ref 65–99)
Glucose-Capillary: 135 mg/dL — ABNORMAL HIGH (ref 65–99)
Glucose-Capillary: 142 mg/dL — ABNORMAL HIGH (ref 65–99)

## 2015-09-03 LAB — PHOSPHORUS: Phosphorus: 3.2 mg/dL (ref 2.5–4.6)

## 2015-09-03 LAB — MAGNESIUM: MAGNESIUM: 2.1 mg/dL (ref 1.7–2.4)

## 2015-09-03 MED ORDER — GLYCOPYRROLATE 0.2 MG/ML IJ SOLN
0.1000 mg | INTRAMUSCULAR | Status: DC | PRN
Start: 2015-09-03 — End: 2015-09-04

## 2015-09-03 MED ORDER — ACETAMINOPHEN 160 MG/5ML PO SOLN
650.0000 mg | ORAL | Status: DC | PRN
  Administered 2015-09-03 – 2015-09-04 (×4): 650 mg
  Filled 2015-09-03 (×4): qty 20.3

## 2015-09-03 MED ORDER — MORPHINE SULFATE (PF) 2 MG/ML IV SOLN
2.0000 mg | INTRAVENOUS | Status: DC | PRN
  Administered 2015-09-04: 2 mg via INTRAVENOUS
  Filled 2015-09-03: qty 1

## 2015-09-03 MED ORDER — LORAZEPAM 2 MG/ML IJ SOLN: 1.0000 mg | INTRAMUSCULAR | Status: DC | PRN

## 2015-09-03 NOTE — Care Management Note (Signed)
Case Management Note  Patient Details  Name: Ann Beasley MRN: [id number] Date of Birth: 1932-01-02  Subjective/Objective:                    Action/Plan: Plan is to discharge to home with hospice . Family contact is Caterra Abello [phone]   Family preference is [facility] phone [phone]   Referral given to [facility] , information faxed also . Will fax Palliative note from today when available.  Expected Discharge Date:                  Expected Discharge Plan:  Home w Hospice Care  In-House Referral:     Discharge planning Services  CM Consult  Post Acute Care Choice:  Hospice Choice offered to:  Adult Children  DME Arranged:    DME Agency:     HH Arranged:    HH Agency:  [facility]  Status of Service:  In process, will continue to follow  Medicare Important Message Given:  Yes Date Medicare IM Given:    Medicare IM give by:    Date Additional Medicare IM Given:    Additional Medicare Important Message give by:     If discussed at [place] of Stay Meetings, dates discussed:    Additional Comments:  Marilu Favre, RN 09/03/2015, 4:18 PM

## 2015-09-03 NOTE — Care Management Important Message (Signed)
Important Message  Patient Details  Name: Ann Beasley MRN: [id number] Date of Birth: February 17, 1932   Medicare Important Message Given:  Yes    Nathen May 09/03/2015, 11:37 AM

## 2015-09-03 NOTE — Progress Notes (Signed)
eLink Physician-Brief Progress Note Patient Name: Ann Beasley DOB: 05-22-1932 MRN: [id number]   Date of Service  09/03/2015  HPI/Events of Note  Fever 101.9 Has ICH Recent flu  eICU Interventions  UA pcxr [facility] tylenal     Intervention Category Intermediate Interventions: Infection - evaluation and management  Amarise Lillo J. 09/03/2015, 12:04 AM

## 2015-09-03 NOTE — Progress Notes (Signed)
PULMONARY / CRITICAL CARE MEDICINE   Name: Ann Beasley MRN: [id number] DOB: [birth year]    ADMISSION DATE:  08/29/2015 CONSULTATION DATE:  08/29/15  REFERRING MD:  ED  CHIEF COMPLAINT: Altered mental status, concern for CVA  HISTORY OF PRESENT ILLNESS:   Ann Beasley is a 82 year old with past mental history of hypertension, asthma, sleep apnea, kidney stones, CLL. She is admitted from 08/26/15 to 08/28/15 with syncope, altered mental status. Found to have a new diagnosis of Flu infection moderate AS. She returns in one day with altered mental status. Found to have a large SAH. Neurosurgery consulted. Started on Nicardipine drip for HTN (BP 210/105).  SUBJECTIVE:  Significant increase in WOB overnight, fevrile  VITAL SIGNS: BP 158/87 mmHg  Pulse 120  Temp(Src) 99.3 F (37.4 C) (Oral)  Resp 33  Ht 5\' 7"  (1.702 m)  Wt 99.791 kg (220 lb)  BMI 34.45 kg/m2  SpO2 97%  HEMODYNAMICS:    VENTILATOR SETTINGS:    INTAKE / OUTPUT: I/O last 3 completed shifts: In: 1763 [I.V.:3; NG/GT:1710; IV Piggyback:50] Out: 2676 [Urine:2400; Drains:276]  PHYSICAL EXAMINATION: General:  Unresponsive, no distress, RR 39 Neuro:  Equal, reactive pupils. HEENT:  No thyromegaly, JVD Cardiovascular:  Regular rate and rhythm, no murmurs rubs gallops Lungs:  Clear, no wheeze, crackles Abdomen:  Soft, positive bowel sounds Musculoskeletal: Normal tone and bulk Skin:  Intact  LABS:  BMET  Recent Labs Lab 09/01/15 0407 09/02/15 0302 09/03/15 0056  NA 146* 144 141  K 3.1* 4.2 4.4  CL 111 105 103  CO2 27 30 27   BUN 21* 13 24*  CREATININE 0.61 0.66 0.71  GLUCOSE 135* 147* 160*   Electrolytes  Recent Labs Lab 09/01/15 0407 09/02/15 0302 09/03/15 0056  CALCIUM 8.3* 9.1 9.2  MG 2.0 2.0 2.1  PHOS 2.4* 2.8 3.2   CBC  Recent Labs Lab 09/01/15 0407 09/02/15 0302 09/03/15 0056  WBC 3.8* 4.1 6.0  HGB 9.3* 10.0* 10.7*  HCT 28.2* 31.8* 33.4*  PLT 154 188 211   Coag's  Recent  Labs Lab 08/29/15 1630  APTT 32  INR 1.03   Sepsis Markers  Recent Labs Lab 08/29/15 1010 08/29/15 1630  LATICACIDVEN 1.98 1.4  PROCALCITON  --  <0.10   ABG  Recent Labs Lab 08/29/15 1622  PHART 7.512*  PCO2ART 37.5  PO2ART 90.0   Liver Enzymes  Recent Labs Lab 08/28/15 0506 08/29/15 1044  AST 64* 62*  ALT 26 31  ALKPHOS 60 86  BILITOT 0.5 0.8  ALBUMIN 2.4* 3.3*   Cardiac Enzymes  Recent Labs Lab 08/29/15 1630 08/29/15 2217 08/30/15 0249  TROPONINI 0.03 0.05* 0.04*   Glucose  Recent Labs Lab 09/02/15 0731 09/02/15 1147 09/02/15 1506 09/02/15 2034 09/02/15 2348 09/03/15 0401  GLUCAP 133* 138* 133* 130* 146* 121*   Imaging Dg Chest Port 1 View  09/03/2015  CLINICAL DATA:  J11.1 (ICD-10-CM) - Influenza EXAM: PORTABLE CHEST - 1 VIEW COMPARISON:  08/29/2015 FINDINGS: Feeding tube has been placed at least as far as the stomach, tip not seen. Lungs clear. Heart size upper limits normal for technique. Tortuous atheromatous thoracic aorta. No effusion. Visualized skeletal structures are unremarkable. IMPRESSION: No acute cardiopulmonary disease. Electronically Signed   By: Lucrezia Europe M.D.   On: 09/03/2015 07:46   STUDIES:  CT head 3/31 > acute intracranial hemorrhage with subarachnoid and intraventricular blood. Acute ventriculomegaly. CTA head 3/31 > negative for aneurysm  CULTURES:   ANTIBIOTICS: Tamiflu off Rocephin 4/4>>>  SIGNIFICANT EVENTS:  LINES/TUBES: Drain 3/30>>>  DISCUSSION: 82 year old with complicated past medical history. Recent admission with syncope, flu a infection. She is now readmitted 1 day after discharge with acute altered mental status. Found to have large subarachnoid, intraventricular hemorrhage with ventriculomegaly. No evidence of arachnoid rupture on CTA  ASSESSMENT / PLAN:  PULMONARY A: Flu a infection. Asthma Sleep apnea P:    Continue Tamiflu will d/c in AM. Supplemental oxygen. Nebulizer when  necessary. Hold off BiPAP for now. Respiratory failure imminent at this point.  CARDIOVASCULAR A:  Hypertensive emergency P:  D/C Nicardipine drip. Goal SBP 100-120. Increased Norvasc to 10 mg PO daily with holding parameter.  RENAL A:   Stable P:   Monitor urine output and creatinine Replace electrolytes as indicated. KVO IVF. TF per nutrition.  GASTROINTESTINAL A:   Stable P:   TF per nutrition. PPI. Insert NGT.  HEMATOLOGIC A:   Stable P:  Monitor.  INFECTIOUS A:   Recent flu a infection P:   Continue Tamiflu, d/c on 4/5.  ENDOCRINE A:   No issues P:   SSI  NEUROLOGIC A:   Altered mental status. Subarachnoid, intraventricular hemorrhage P:   And neurosurgery following. Make get a drain. BP control with nicardipine.  FAMILY  -  No family bedside.  Called daughter informed that patient is deteriorating and is unlikely to survive the day with such a rise in respiratory rate.  No BiPAP since patient not protecting her airway.  Palliative meeting at 2:30 and recommend morphine.  - Inter-disciplinary family meet or Palliative Care meeting due by:  4/7.  The patient is critically ill with multiple organ systems failure and requires high complexity decision making for assessment and support, frequent evaluation and titration of therapies, application of advanced monitoring technologies and extensive interpretation of multiple databases.   Critical Care Time devoted to patient care services described in this note is  35  Minutes. This time reflects time of care of this signee Dr Jennet Maduro. This critical care time does not reflect procedure time, or teaching time or supervisory time of PA/NP/Med student/Med Resident etc but could involve care discussion time.  Rush Farmer, M.D. [facility] Pulmonary/Critical Care Medicine. Pager: [phone]. After hours pager: [phone].  09/03/2015, 11:53 AM

## 2015-09-03 NOTE — Progress Notes (Addendum)
See consult note

## 2015-09-03 NOTE — Progress Notes (Signed)
No acute events Worse neurologically today than yesterday morning [id number] EVD draining well CT did not show anything upon which to intervene Poor prognosis Will participate in family discussion when scheduled

## 2015-09-03 NOTE — Progress Notes (Signed)
Patient has fever of 101.9. Resp 30. Dr. Titus Mould notified. Orders received for U/A, cxray, blood cx, and tylenol PRN. Will continue to monitor.

## 2015-09-04 DIAGNOSIS — Z4659 Encounter for fitting and adjustment of other gastrointestinal appliance and device: Secondary | ICD-10-CM | POA: Insufficient documentation

## 2015-09-04 DIAGNOSIS — J111 Influenza due to unidentified influenza virus with other respiratory manifestations: Secondary | ICD-10-CM

## 2015-09-04 DIAGNOSIS — Z09 Encounter for follow-up examination after completed treatment for conditions other than malignant neoplasm: Secondary | ICD-10-CM | POA: Insufficient documentation

## 2015-09-04 LAB — GLUCOSE, CAPILLARY
GLUCOSE-CAPILLARY: 131 mg/dL — AB (ref 65–99)
GLUCOSE-CAPILLARY: 145 mg/dL — AB (ref 65–99)
Glucose-Capillary: 133 mg/dL — ABNORMAL HIGH (ref 65–99)
Glucose-Capillary: 123 mg/dL — ABNORMAL HIGH (ref 65–99)

## 2015-09-04 MED ORDER — CLONIDINE HCL 0.2 MG/24HR TD PTWK: 0.2000 mg | MEDICATED_PATCH | TRANSDERMAL | Status: AC

## 2015-09-04 MED ORDER — MORPHINE SULFATE (CONCENTRATE) 10 MG /0.5 ML PO SOLN: 10.0000 mg | ORAL | Status: AC | PRN

## 2015-09-04 MED ORDER — LORAZEPAM 2 MG/ML PO CONC: 1.0000 mg | ORAL | Status: AC | PRN

## 2015-09-04 MED ORDER — AMLODIPINE BESYLATE 10 MG PO TABS: 10.0000 mg | ORAL_TABLET | Freq: Every day | ORAL | Status: AC

## 2015-09-04 MED ORDER — CLONIDINE HCL 0.2 MG/24HR TD PTWK
0.2000 mg | MEDICATED_PATCH | TRANSDERMAL | Status: DC
  Administered 2015-09-04: 0.2 mg via TRANSDERMAL
  Filled 2015-09-04: qty 1

## 2015-09-04 MED ORDER — SCOPOLAMINE 1 MG/3DAYS TD PT72: 1.0000 | MEDICATED_PATCH | TRANSDERMAL | Status: AC | PRN

## 2015-09-04 NOTE — Progress Notes (Signed)
Pt for dc home today with family and [facility].  Confirmed with pt's daughter in law, Sharyn Lull, that all equipment has been delivered to pt's home.  Will plan on calling [facility] around lunchtime to allow bath, medications to be completed prior to transport.   Daughter in law made aware that we have no way of knowing what time [facility] will transport; it is on a first-come, first-serve basis.  She verbalizes understanding of this.    Reinaldo Raddle, RN, BSN  Trauma/Neuro ICU Case Manager [phone]

## 2015-09-04 NOTE — Progress Notes (Signed)
[facility] arrived for d/c. IVC drain was then removed at 19:25 by Dr. Christella Noa. Morphine 2 mg IV given for pain and then PIV d/c as per instruction of hospice RN liaison as IV not needed at hospice home. Patient in NAD upon d/c. CPOT 0. Daughter in law at bedside. Small intact reddened mostly blanchable area on sacrum noted. Prophylactic foam dressing on sacrum. Patient d/c at 19:37 with foley catheter and 02 at 3L [place] via [facility] transport to [place]. DNR form and transport form accompanied patient. Abigail Butts, hospice RN given copy of AVS. Prescriptions for morphine, ativan, and clonidine patch collected from patient's daughter-in-law and shredded as patient will not need them for admission to hospice home per hospice liaison Abigail Butts. Report called to Management consultant at [facility].

## 2015-09-04 NOTE — Progress Notes (Signed)
Patient's daughter in law informed RN that plans have changed and now patient is to d/c to the [facility] facility instead of going home with family. Daughter states that she has been speaking with [facility] of hospice. Leveda Anna, CSW, informed of change of plans and is to follow-up and notify [facility] of change in destination.

## 2015-09-04 NOTE — Progress Notes (Signed)
Plan apparently for discharge today Agree with this Poor prognosis wrt long term function

## 2015-09-04 NOTE — Progress Notes (Signed)
PMT RN: family contacted PMT this am; they spoke with hospice last night and request foley stay in at [facility] d/c. Obtained rx to do so.  Larina Earthly, RN, BSN, [facility] 09/04/2015 9:18 AM Cell [phone] 8:00-4:00 Monday-Friday Office [phone]

## 2015-09-04 NOTE — Progress Notes (Signed)
RN spoke with daughter-in-law via telephone. She stated that the hospital equipment has arrived to the house. Daughter-in-law aware that patient will be transported via [facility] to home for hospice care and will arrive with foley catheter and PIV with other drains/lines removed prior to d/c from hospital. Also aware that paperwork including DNR form and prescriptions will accompany patient. Questions answered.

## 2015-09-04 NOTE — Progress Notes (Signed)
The patient's daughter in law, Sharyn Lull, requested to take the patient's prescriptions early while waiting for PTAR in order to avoid delay in getting prescriptions filled. RN clarified with [facility] who spoke with risk management. Dr. Hilma Favors also had given approval for early release. The daughter in law was given the prescriptions; dosage, route and frequency reviewed with her. Daughter in law advised to speak with Hospice for any further medication questions. AVS was also given, reviewed and signed by daughter. Copy of AVS placed in chart. Education included on hemorrhagic strokes. Questions answered. Emotional support given. Daughter in law very appreciate of care provided for the patient.

## 2015-09-04 NOTE — Progress Notes (Addendum)
Provided assistance with discharge preparation this AM- med reconciliation completed. Scripts printed for Roxanol, Ativan Intensol, T-Scop and Clonidine Transdermal for BP control in setting of increased ICP. Goal is to get her home with hospice and focus on comfort and QOL.  Time: 30 minutes No Charge  Lane Hacker, [place]

## 2015-09-08 LAB — CULTURE, BLOOD (ROUTINE X 2)
Culture: NO GROWTH
Culture: NO GROWTH

## 2015-09-12 ENCOUNTER — Telehealth: Payer: Self-pay | Admitting: Family Medicine

## 2015-09-12 NOTE — Telephone Encounter (Signed)
Erroneous encounter. Disregard.

## 2015-09-29 DEATH — deceased

## 2015-12-04 NOTE — Consult Note (Signed)
Consultation Note Date: 12/04/2015   Patient Name: Ann Beasley  DOB: 1931-10-26  MRN: [id number]  Age / Sex: 82 y.o., female  PCP: Ernestene Kiel, MD Referring Physician: No att. providers found  Reason for Consultation: Establishing goals of care, Pain control, Psychosocial/spiritual support and Terminal Care  HPI/Patient Profile: Ann Beasley is a 82 year old from [facility] with past mental history of hypertension, asthma, sleep apnea, kidney stones, CLL. She was admitted from 08/26/15 to 08/28/15 with syncope, altered mental status. Found to have a new diagnosis of Flu infection moderate AS. She was discharged and readmitted the next day and found to have a large SAH. Neurosurgery consulted and feel that she would not survive surgicaention.   Clinical Assessment and Goals of Care: Ann Beasley overall condition is very poor- if she were to survive it would be with significant debility and poor QOL. Family state clearly that she would never want this QOL and had suffered for the past year and often told them she was ready to die.  HCPOA    SUMMARY OF RECOMMENDATIONS   Met with patient's family: husband, son, DIL and grandchildren. Given her previously stated wishes to not live a debilitated life dependent on aggressive medical interventions they have decided to transition to comfort care and to honor her wish to die at home.   Plan for tomorrow is discharge home with [facility]- we will need to get the equipment in the home ASAP and ready- they are anxious and aware of risk that she may die before, during or shortly after transport home. I have discussed the ventriculostomy with Dr. Cyndy Freeze and with RN on the unit. I will remove the ventriculostomy when [facility]/Transport arrives to give her every best chance of making the trip home. Will administer and prescribe comfort medications prior to  transport. Will leave any central access that she has in case IV medications are needed and maintain foley for comfort. Continue all current interventions right up until transport home. PMT will assist. Ok to remove PANDA tube now for comfort.  Code Status/Advance Care Planning:  DNR    Symptom Management:   Scheduled and PRN Morphine-will determine best home regimen after discussing with hospice  Palliative Prophylaxis:   Aspiration, Bowel Regimen, Eye Care, Frequent Pain Assessment, Oral Care and Turn Reposition  Additional Recommendations (Limitations, Scope, Preferences):  Full Comfort Care  Psycho-social/Spiritual:   Desire for further Chaplaincy support:yes  Additional Recommendations: Education on Hospice  Prognosis:   Hours - Days  Discharge Planning: Home with Hospice      Primary Diagnoses: Present on Admission:  **None**  I have reviewed the medical record, interviewed the patient and family, and examined the patient. The following aspects are pertinent.  Past Medical History  Diagnosis Date  . HTN (hypertension)   . Angina at [facility])     history of  . Asthma   . Hyperlipidemia   . Stroke ([place])   . Kidney stones     "passed it"  . On home  oxygen therapy     "2L 24/7" (08/26/2015)  . Sleep apnea     "suppose to wear mask; doesn't" (08/26/2015)  . History of stomach ulcers   . TIA (transient ischemic attack) "several"  . Arthritis     "knees, hands" (08/26/2015)  . CLL (chronic lymphocytic leukemia) ([place]) dx'd ~ 2010    "stable" (08/26/2015)   Social History   Social History  . Marital Status: Married    Spouse Name: N/A  . Number of Children: N/A  . Years of Education: N/A   Occupational History  . retired    Social History Main Topics  . Smoking status: Never Smoker   . Smokeless tobacco: Never Used  . Alcohol Use: No  . Drug Use: No  . Sexual Activity: No   Other Topics Concern  . None   Social History Narrative   Family  History  Problem Relation Age of Onset  . Hypertension Father   . Bone cancer Father     multiple type of cancers  . Heart disease Mother    Scheduled Meds: Continuous Infusions: PRN Meds:. Medications Prior to Admission:  Prior to Admission medications   Medication Sig Start Date End Date Taking? Authorizing Provider  albuterol (PROVENTIL HFA;VENTOLIN HFA) 108 (90 BASE) MCG/ACT inhaler Inhale 1 puff into the lungs every 6 (six) hours as needed for wheezing.    Yes Historical Provider, MD  albuterol (PROVENTIL) (2.5 MG/3ML) 0.083% nebulizer solution Take 2.5 mg by nebulization every 6 (six) hours as needed for wheezing or shortness of breath.   Yes Historical Provider, MD  Fluticasone-Salmeterol (ADVAIR) 100-50 MCG/DOSE AEPB Inhale 1 puff into the lungs 2 (two) times daily as needed. Reported on 08/27/2015   Yes Historical Provider, MD  amLODipine (NORVASC) 10 MG tablet Take 1 tablet (10 mg total) by mouth daily. 09/04/15   Acquanetta Chain, DO  cloNIDine (CATAPRES - DOSED IN MG/24 HR) 0.2 mg/24hr patch Place 1 patch (0.2 mg total) onto the skin once a week. 09/04/15   Acquanetta Chain, DO  LORazepam (ATIVAN) 2 MG/ML concentrated solution Take 0.5 mLs (1 mg total) by mouth every 4 (four) hours as needed for anxiety, seizure, sedation or sleep. 09/04/15   Acquanetta Chain, DO  Morphine Sulfate (MORPHINE CONCENTRATE) 10 mg / 0.5 ml concentrated solution Take 0.5-1 mLs (10-20 mg total) by mouth every hour as needed for moderate pain, severe pain, anxiety or shortness of breath. 09/04/15   Acquanetta Chain, DO  scopolamine (TRANSDERM-SCOP, 1.5 MG,) 1 MG/3DAYS Place 1 patch (1.5 mg total) onto the skin every 3 (three) days as needed (secretions). 09/04/15   Acquanetta Chain, DO   No Known Allergies Review of Systems  Physical Exam  Vital Signs: BP 138/86 mmHg  Pulse 130  Temp(Src) 101 F (38.3 C) (Axillary)  Resp 22  Ht _0  (1.702 m)  Wt 99.791 kg (220 lb)  BMI 34.45 kg/m2  SpO2  100% Pain Assessment: CPOT   Pain Score: 0-No pain   SpO2: SpO2: 100 % O2 Device:SpO2: 100 % O2 Flow Rate: .O2 Flow Rate (L/min): 3 L/min  IO: Intake/output summary: No intake or output data in the 24 hours ending 12/04/15 1111  LBM: Last BM Date: 09/03/15 Baseline Weight: Weight: 99.791 kg (220 lb) Most recent weight: Weight: 99.791 kg (220 lb)     Palliative Assessment/Data:   Flowsheet Rows        Most Recent Value   Intake Tab    Referral  Department  -- [ED]   Unit at Time of Referral  ER   Palliative Care Primary Diagnosis  Neurology   Date Notified  08/29/15   Palliative Care Type  New Palliative care   Reason for referral  Clarify Goals of Care   Date of Admission  08/29/15   Date first seen by Palliative Care  08/31/15   # of days Palliative referral response time  2 Day(s)   # of days IP prior to Palliative referral  0   Clinical Assessment    Psychosocial & Spiritual Assessment    Palliative Care Outcomes    Actual Discharge Date  09/04/15 [hospice facility - [place]]      Time In: 5PM Time Out: 6:10PM Time Total: 70- minutes Greater than 50%  of this time was spent counseling and coordinating care related to the above assessment and plan.  Signed by: Lane Hacker, DO   Please contact Palliative Medicine Team phone at [phone] for questions and concerns.  For individual provider: See Shea Evans

## 2016-01-01 ENCOUNTER — Encounter: Payer: Self-pay | Admitting: Pulmonary Disease

## 2016-01-02 NOTE — Discharge Summary (Signed)
Physician Discharge Summary       Patient ID: Ann Beasley MRN: [id number] DOB/AGE: Apr 03, 1932 82 y.o.  Admit date: 08/29/2015 Discharge date: 01/02/2016  Discharge Diagnoses:   Flu a infection. Asthma Sleep apnea Hypertensive emergency Acute encephalopathy  Subarachnoid, intraventricular hemorrhage   Detailed Hospital Course:  [name] is a 82 year old with past mental history of hypertension, asthma, sleep apnea, kidney stones, CLL. She is admitted from 08/26/15 to 08/28/15 with syncope, altered mental status. Found to have a new diagnosis of Flu infection moderate AS. She returns in one day with altered mental status. Found to have a large SAH. Neurosurgery consulted. Started on Nicardipine drip for HTN (BP 210/105). After long family discussion family opted for hospice and palliative care. She was transferred to [facility].    Significant Hospital tests/ studies  Consults: palliative care   Discharge Exam: BP 138/86   Pulse (!) 130   Temp (!) 101 F (38.3 C) (Axillary)   Resp (!) 22   Ht 5\' 7"  (1.702 m)   Wt 220 lb (99.8 kg)   SpO2 100%   BMI 34.46 kg/m   General:  Unresponsive, no distress, RR 39 Neuro:  Equal, reactive pupils. HEENT:  No thyromegaly, JVD Cardiovascular:  Regular rate and rhythm, no murmurs rubs gallops Lungs:  Clear, no wheeze, crackles Abdomen:  Soft, positive bowel sounds Musculoskeletal: Normal tone and bulk Skin:  Intact  Labs at discharge Lab Results  Component Value Date   CREATININE 0.71 09/03/2015   BUN 24 (H) 09/03/2015   NA 141 09/03/2015   K 4.4 09/03/2015   CL 103 09/03/2015   CO2 27 09/03/2015   Lab Results  Component Value Date   WBC 6.0 09/03/2015   HGB 10.7 (L) 09/03/2015   HCT 33.4 (L) 09/03/2015   MCV 98.2 09/03/2015   PLT 211 09/03/2015   Lab Results  Component Value Date   ALT 31 08/29/2015   AST 62 (H) 08/29/2015   ALKPHOS 86 08/29/2015   BILITOT 0.8 08/29/2015   Lab Results    Component Value Date   INR 1.03 08/29/2015   INR 1.21 03/16/2013    Current radiology studies No results found.  Disposition:  51-Hospice/Medical Facility   Follow-up Information    [facility]   Specialty:  Home Health Services Contact information: [place] [phone]           Discharged Condition: terminally ill   Physician Statement:   The Patient was personally examined, the discharge assessment and plan has been personally reviewed and I agree with ACNP Ann Beasley's assessment and plan. > 30 minutes of time have been dedicated to discharge assessment, planning and discharge instructions.   Signed: Clementeen Graham 01/02/2016, 2:35 PM

## 2016-01-03 NOTE — Discharge Summary (Signed)
Ann Beasley, Ann Beasley            ACCOUNT NO.:  [id number]  MEDICAL RECORD NO.:  [id number]  LOCATION:  [id number]                        FACILITY:  [facility]  PHYSICIAN:  Providence Lanius, MD  DATE OF BIRTH:  1932/03/06  DATE OF ADMISSION:  08/29/2015 DATE OF DISCHARGE:  09/04/2015                              DISCHARGE SUMMARY   DEATH SUMMARY  PRIMARY DIAGNOSIS/CAUSE OF DEATH:  Subarachnoid hemorrhage.  SECONDARY DIAGNOSES:  Acute encephalopathy, asthma, acute respiratory failure, hypertensive emergency, intraventricular hemorrhage.  HOSPITAL COURSE:  The patient was an 82 year old female with past medical history significant for hypertension.  She went into hypertensive emergency, subarachnoid hemorrhage, and intraventricular hemorrhage.  The patient was a DNR status with altered mental status. The patient started to develop acute respiratory failure and airway block.  Decision was made not to start BiPAP at that point.  The family was contacted and informed of the situation to make a decision for full palliative care.  The family were informed on arrival at 2:30 when they arrived to the hospital and admitted with Palliative Care Team. Decision was made for palliation at which point with the assistance of Palliative Care Team.     Providence Lanius, MD     WJY/MEDQ  D:  01/02/2016  T:  01/03/2016  Job:  [id number]

## 2016-08-10 IMAGING — CT CT HEAD W/O CM
2 series · 15 of 30 positions shown, 17 images · non-contrast
Comparison: None.

CLINICAL DATA: Syncopal event

EXAM:
CT HEAD WITHOUT CONTRAST
TECHNIQUE: Contiguous axial images were obtained from the base of the skull
through the vertex without intravenous contrast.

[Series 2: head without · axial · non-contrast · 0.44mm/px · z∈[-191,-71]mm · 7 of 32 slices shown, 9 images]
[im 4/32  brain]
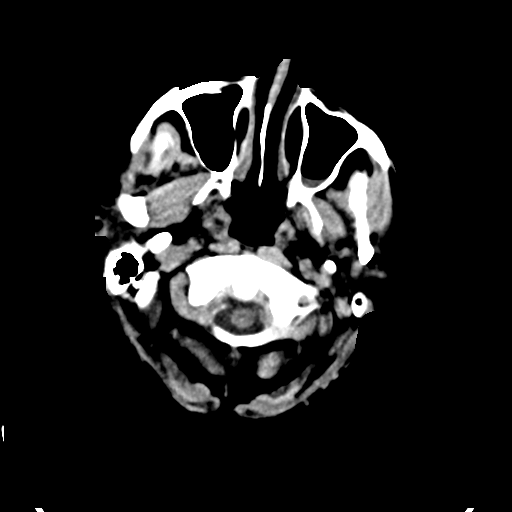
[im 4/32  bone]
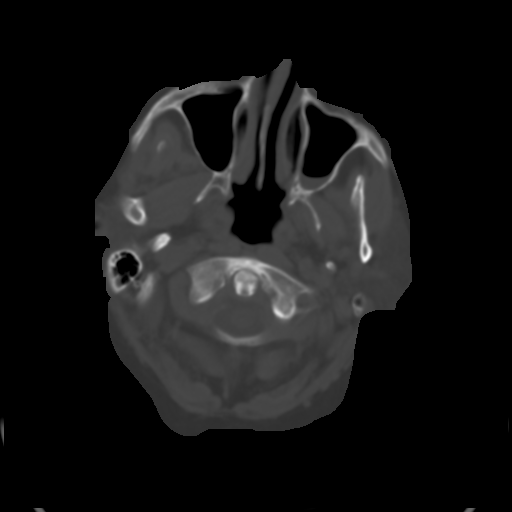
[im 8/32  brain]
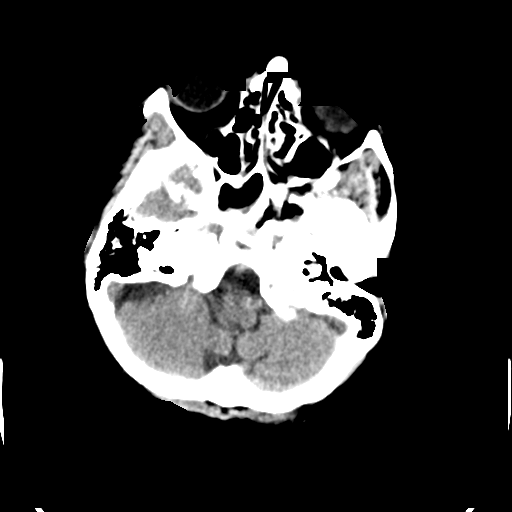
[im 12/32  brain]
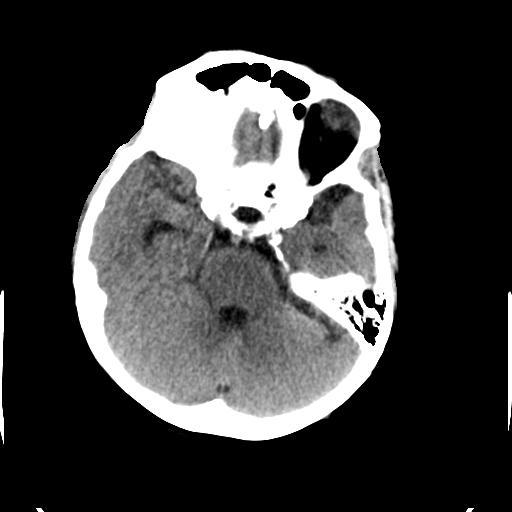
[im 16/32  brain]
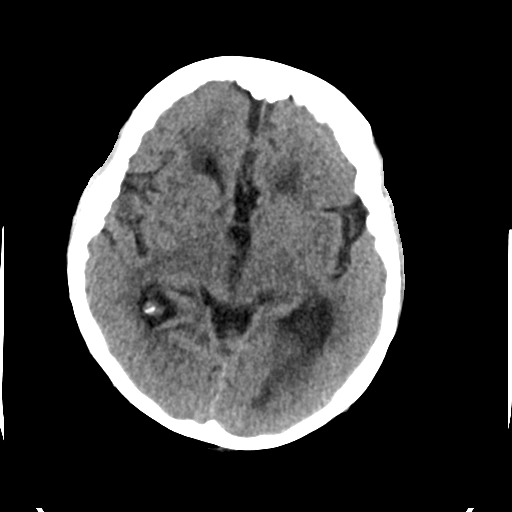
[im 20/32  brain]
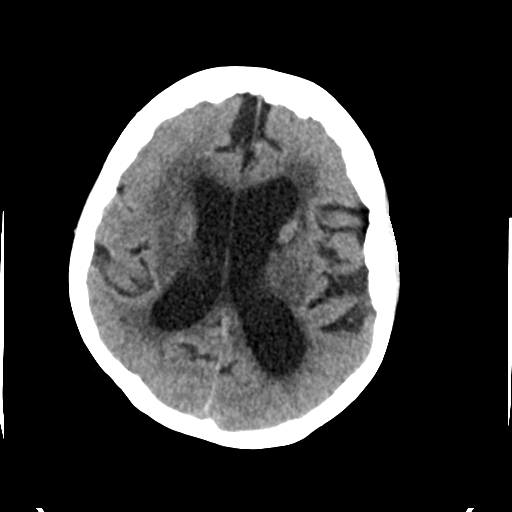
[im 20/32  bone]
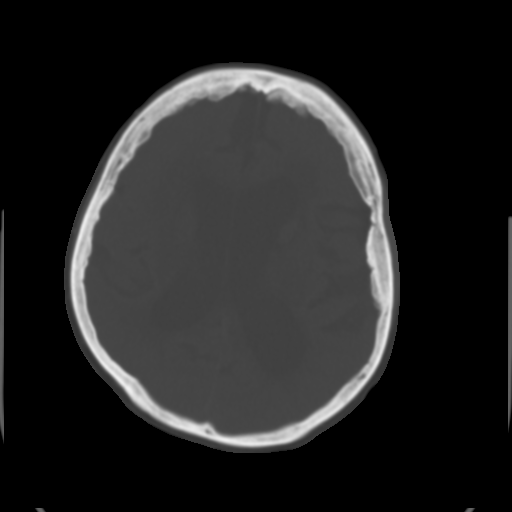
[im 24/32  brain]
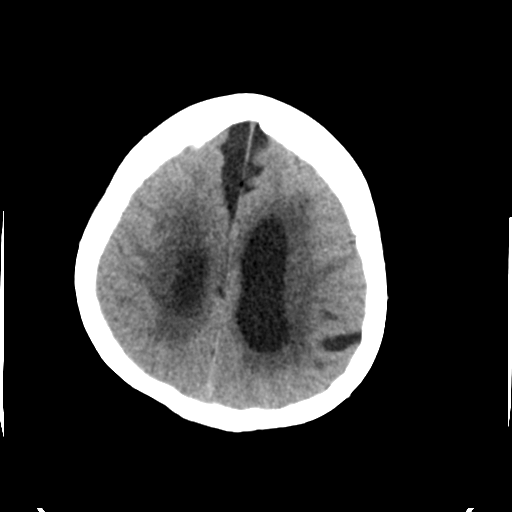
[im 28/32  brain]
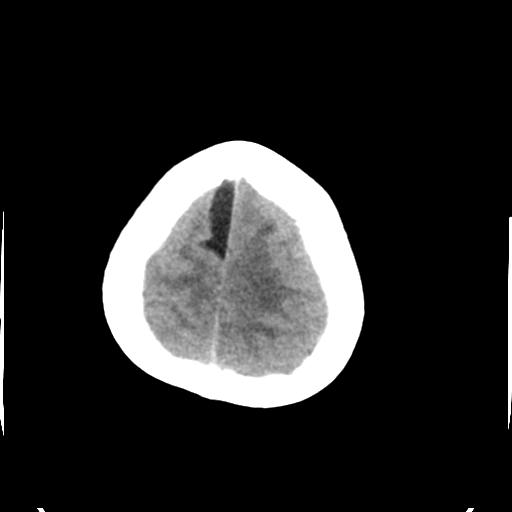

[Series 3: head bone · axial · 0.44mm/px · z∈[-192,-66]mm · 8 of 79 slices shown]
[im 8/79  bone]
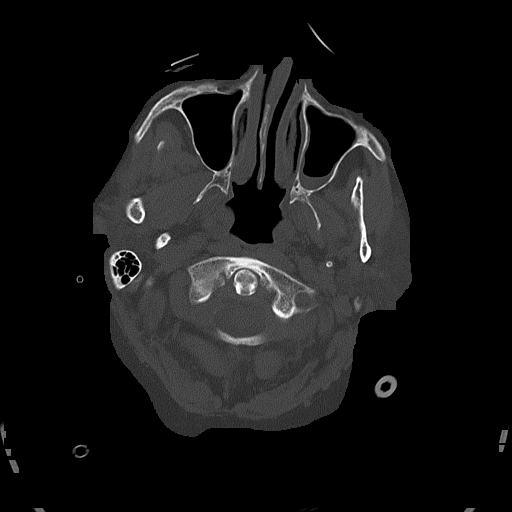
[im 16/79  bone]
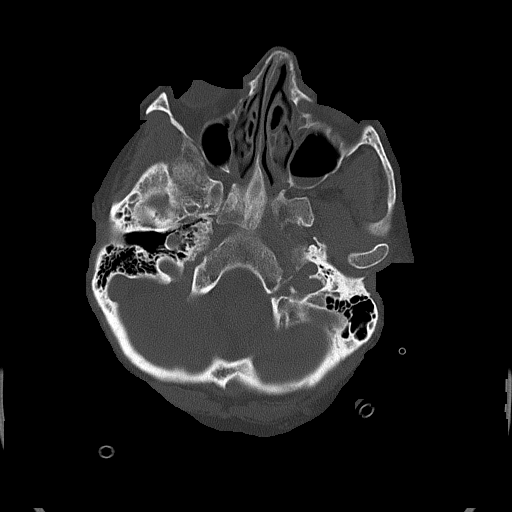
[im 24/79  bone]
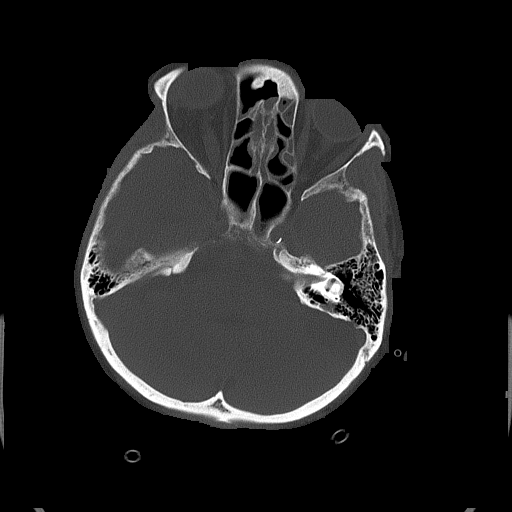
[im 36/79  bone]
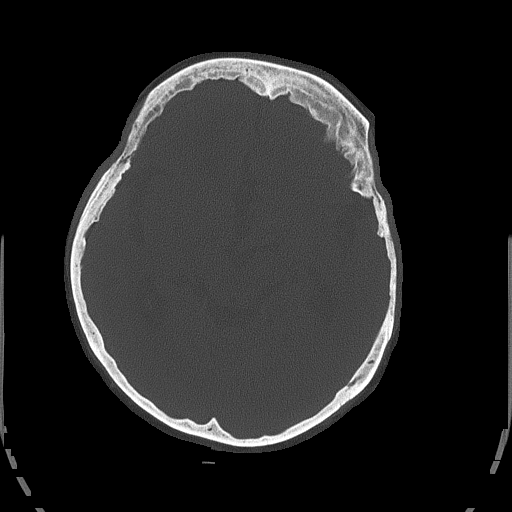
[im 43/79  bone]
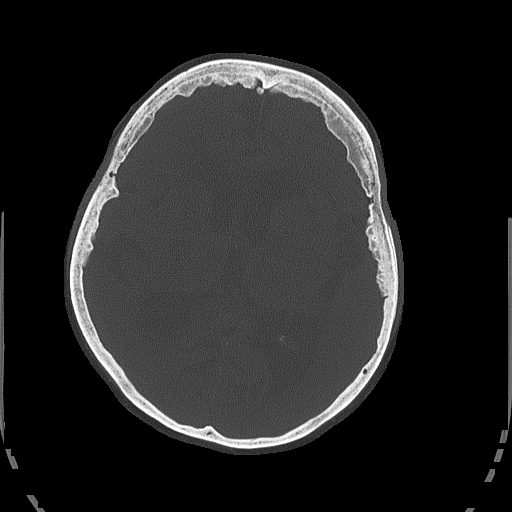
[im 55/79  bone]
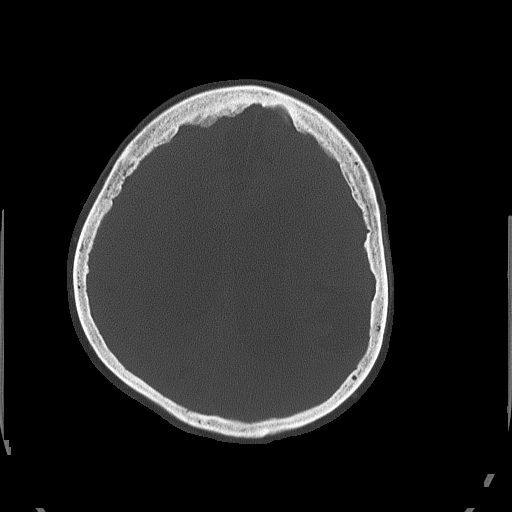
[im 63/79  bone]
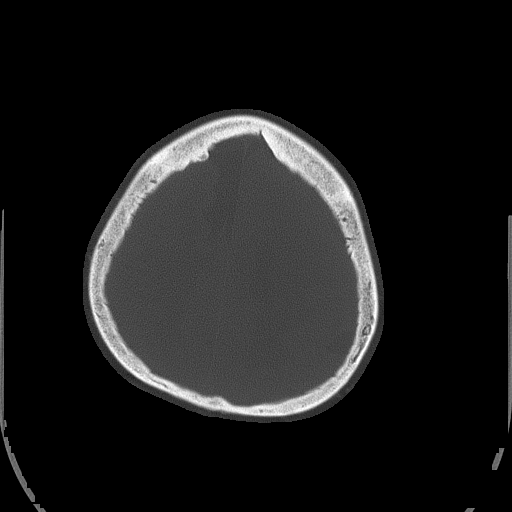
[im 71/79  bone]
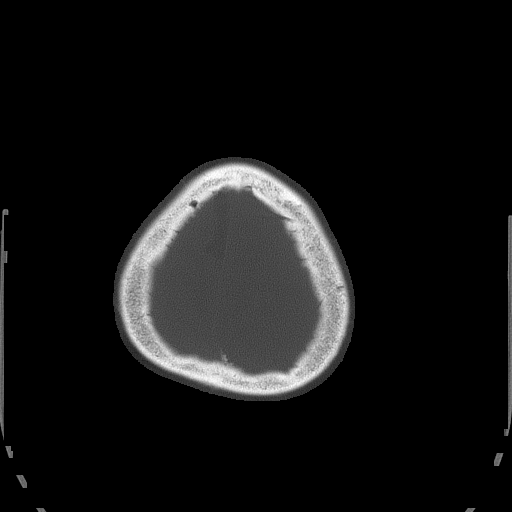

[15 of 30 positions shown; findings below may reference images not displayed]

FINDINGS: Bony calvarium is intact. Diffuse mucosal thickening is noted within
the paranasal sinuses. Small air-fluid level is noted in the left
maxillary antrum consistent with sinusitis.

Mild atrophic changes are noted. Chronic white matter ischemic
change is seen as well. No findings to suggest acute hemorrhage,
acute infarction or space-occupying mass lesion are noted. Left
thalamic lacunar infarct is noted.
IMPRESSION: Chronic atrophic and ischemic changes.

Changes consistent with acute on chronic sinusitis in the left
maxillary antrum.

## 2016-08-18 IMAGING — CR DG CHEST 1V PORT
1 series · 1 of 1 positions shown · non-contrast
Comparison: 08/29/2015

CLINICAL DATA: A99.9 (BC5-3H-CM) - Influenza

EXAM:
PORTABLE CHEST - 1 VIEW

[AP]
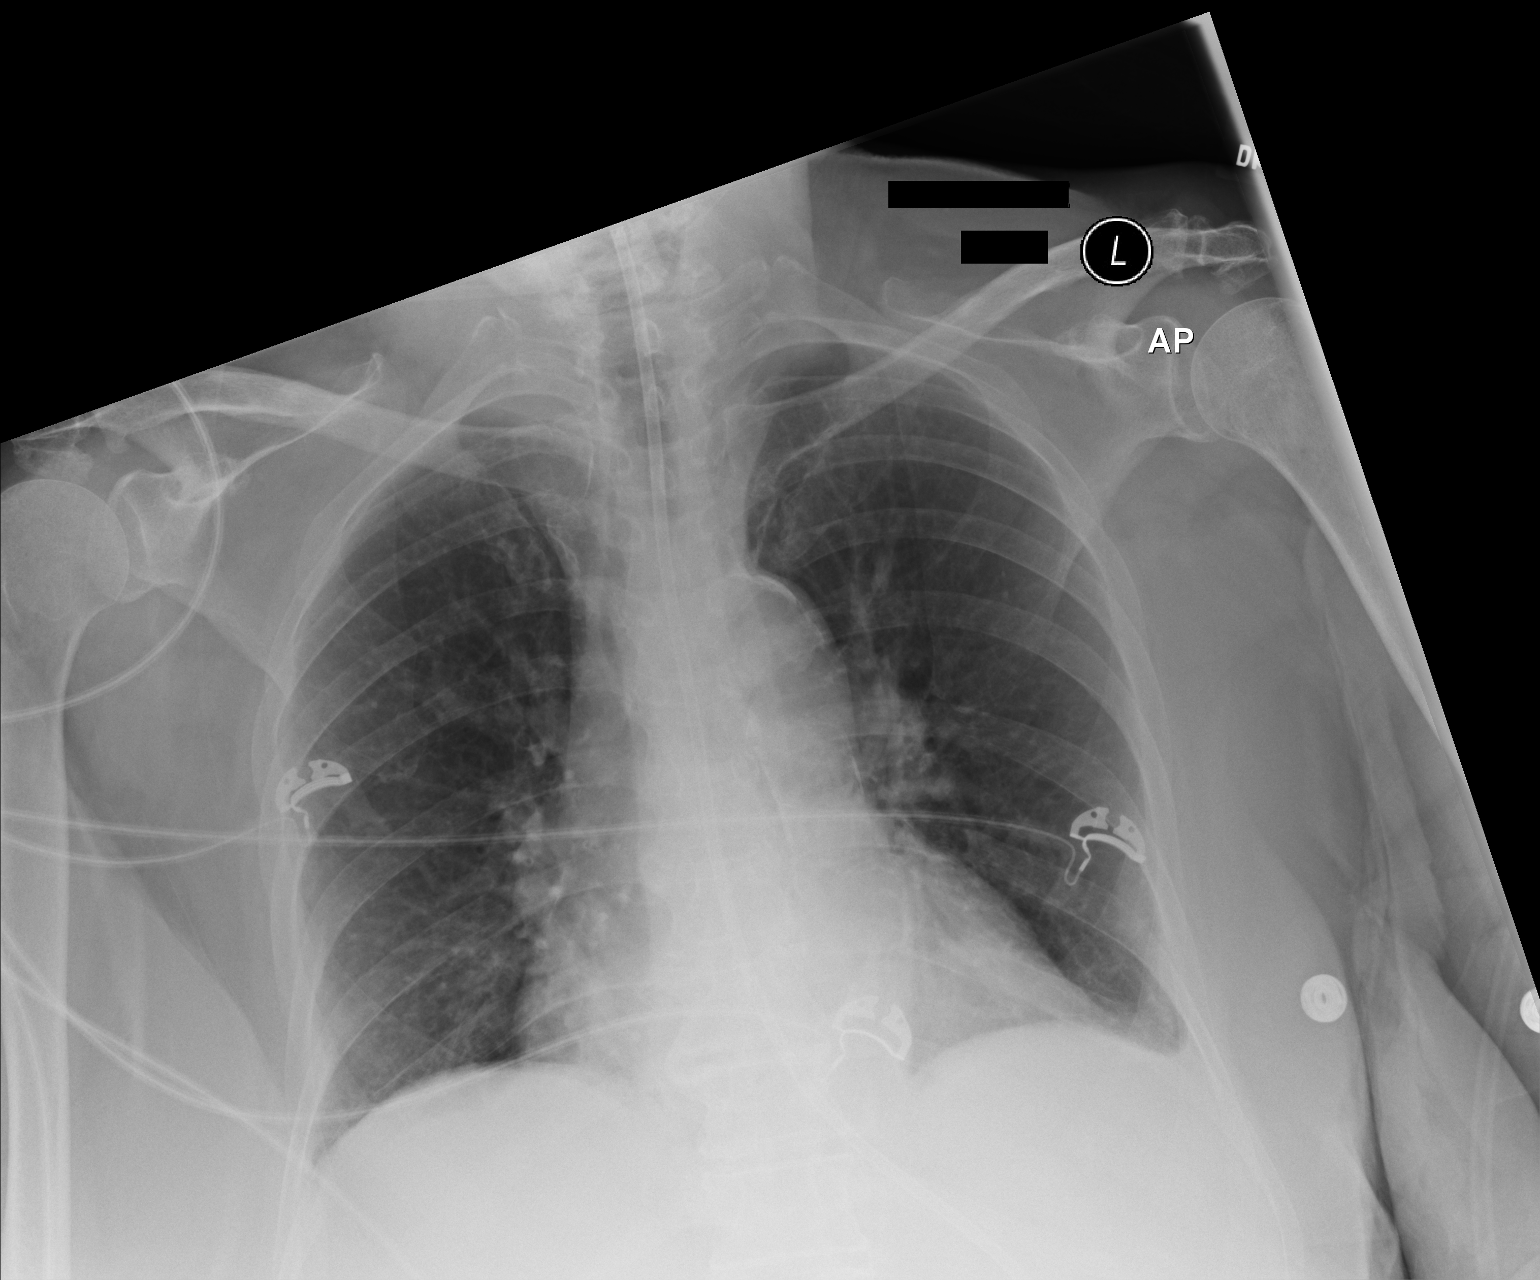

[1 of 1 positions shown; findings below may reference images not displayed]

FINDINGS: Feeding tube has been placed at least as far as the stomach, tip not
seen. Lungs clear. Heart size upper limits normal for technique.
Tortuous atheromatous thoracic aorta.
No effusion.
Visualized skeletal structures are unremarkable.
IMPRESSION: No acute cardiopulmonary disease.
